# Patient Record
Sex: Female | Born: 1935 | Race: Black or African American | Hispanic: No | Smoking: Never smoker
Health system: Southern US, Community
[De-identification: ages and names within clinical notes are randomized; demographics above are authoritative.]

## PROBLEM LIST (undated history)

## (undated) DIAGNOSIS — I10 Essential (primary) hypertension: Secondary | ICD-10-CM

## (undated) DIAGNOSIS — Z9289 Personal history of other medical treatment: Secondary | ICD-10-CM

## (undated) DIAGNOSIS — D649 Anemia, unspecified: Secondary | ICD-10-CM

## (undated) DIAGNOSIS — I2510 Atherosclerotic heart disease of native coronary artery without angina pectoris: Secondary | ICD-10-CM

## (undated) DIAGNOSIS — J189 Pneumonia, unspecified organism: Secondary | ICD-10-CM

## (undated) DIAGNOSIS — S069X9A Unspecified intracranial injury with loss of consciousness of unspecified duration, initial encounter: Secondary | ICD-10-CM

## (undated) DIAGNOSIS — E119 Type 2 diabetes mellitus without complications: Secondary | ICD-10-CM

## (undated) DIAGNOSIS — N189 Chronic kidney disease, unspecified: Secondary | ICD-10-CM

## (undated) DIAGNOSIS — R0600 Dyspnea, unspecified: Secondary | ICD-10-CM

## (undated) DIAGNOSIS — J45909 Unspecified asthma, uncomplicated: Secondary | ICD-10-CM

## (undated) DIAGNOSIS — M1990 Unspecified osteoarthritis, unspecified site: Secondary | ICD-10-CM

## (undated) HISTORY — DX: Essential (primary) hypertension: I10

## (undated) HISTORY — DX: Type 2 diabetes mellitus without complications: E11.9

## (undated) HISTORY — DX: Anemia, unspecified: D64.9

## (undated) HISTORY — PX: ROTATOR CUFF REPAIR: SHX139

## (undated) HISTORY — PX: COLONOSCOPY: SHX174

## (undated) HISTORY — DX: Chronic kidney disease, unspecified: N18.9

## (undated) HISTORY — PX: CORONARY ANGIOPLASTY WITH STENT PLACEMENT: SHX49

---

## 1973-01-11 HISTORY — PX: ABDOMINAL HYSTERECTOMY: SHX81

## 2004-02-18 ENCOUNTER — Ambulatory Visit: Payer: Self-pay | Admitting: Cardiology

## 2004-02-19 ENCOUNTER — Ambulatory Visit: Payer: Self-pay | Admitting: Cardiology

## 2004-02-19 ENCOUNTER — Observation Stay (HOSPITAL_COMMUNITY): Admission: EM | Admit: 2004-02-19 | Discharge: 2004-02-21 | Payer: Self-pay | Admitting: Cardiology

## 2004-03-05 ENCOUNTER — Ambulatory Visit: Payer: Self-pay | Admitting: Cardiology

## 2004-04-09 ENCOUNTER — Ambulatory Visit: Payer: Self-pay | Admitting: Cardiology

## 2007-05-08 ENCOUNTER — Encounter: Admission: RE | Admit: 2007-05-08 | Discharge: 2007-05-08 | Payer: Self-pay | Admitting: Orthopaedic Surgery

## 2007-05-11 ENCOUNTER — Ambulatory Visit (HOSPITAL_BASED_OUTPATIENT_CLINIC_OR_DEPARTMENT_OTHER): Admission: RE | Admit: 2007-05-11 | Discharge: 2007-05-12 | Payer: Self-pay | Admitting: Orthopaedic Surgery

## 2010-02-01 ENCOUNTER — Encounter: Payer: Self-pay | Admitting: Endocrinology

## 2010-05-26 NOTE — Op Note (Signed)
NAMESHANIKKA, AUGUSTE NO.:  [id number]   MEDICAL RECORD NO.:  [id number]          PATIENT TYPE:  AMB   LOCATION:  DSC                          FACILITY:  [facility]   PHYSICIAN:  Vonna Kotyk. Whitfield, M.D.DATE OF BIRTH:  [birth year]   DATE OF PROCEDURE:  05/11/2007  DATE OF DISCHARGE:                               OPERATIVE REPORT   PREOPERATIVE DIAGNOSES:  1. Rotator cuff tear, right shoulder with impingement.  2. Degenerative joint disease, acromioclavicular joint.   POSTOPERATIVE DIAGNOSES:  1. Rotator cuff tear, right shoulder with impingement.  2. Degenerative joint disease, acromioclavicular joint.  3. Biceps tendon rupture.   PROCEDURES:  1. Arthroscopic debridement, right shoulder.  2. Arthroscopic subacromial decompression.  3. Arthroscopic distal clavicle resection.  4. Mini-open rotator cuff tear repair surgery.   SURGEON:  Vonna Kotyk. Durward Fortes, M.D.   ASSISTANT:  Vonita Moss. Duffy, PAC   ANESTHESIA:  General with supplemental interscalene nerve block.   COMPLICATIONS:  None.   HISTORY:  This 77 year old diabetic who has been experiencing right  shoulder pain since December 2008.  Her primary care physician has  injected her shoulder with cortisone on 2 occasions.  She has been  taking pain medicine and tried some exercises and continues to have  significant pain to the point of compromise.  She is unable to have an  MRI scan secondary to claustrophobia and has had a shoulder arthrogram  revealing a full-thickness rotator cuff tear.  She has evidence of  impingement by the [facility] joint osteoarthropathy and a downsloping acromion  and is now to have a arthroscopic evaluation.   DESCRIPTION OF PROCEDURE:  With the patient comfortable on operating  table and under general orotracheal anesthesia, the patient was placed  in semi-sitting position with the shoulder frame.  She did receive a  preoperative interscalene nerve block.  The right shoulder was then  prepped with DuraPrep in base of the base of neck circumferentially, and  below the elbow, sterile draping was performed.   Marking pen was used to outline the [facility] joint, the coracoid, and the  acromion at a point a fingerbreadth posterior medial to the posterior  angle of acromion, a small stab wound was made.  The arthroscope was  placed in the shoulder joint posteriorly.  The cannula inserted through  a separate portal anteriorly.  There was moderate synovitis.  There was  absence of the biceps tendon with a small stump at the superior glenoid.  The labrum appeared to be intact.  There was an obvious rotator cuff  tear.  Debridement of the biceps tendon stump and synovitis was  performed through the anterior portal using the ArthroCare wand and the  Cuda shaver.  There was some mild chondromalacia of the humeral head and  glenoid.   The arthroscope was placed at subacromial space posteriorly.  The  cannula subacromial space anteriorly and anteroinferior acromioplasty  was performed.  There was considerable overhang and spurring of the  acromion both anteriorly and laterally, had a nice decompression.  There  was considerable hypertrophy about the [facility] joint.  Distal clavicle  resection was also performed with a 6 mm bur.  There was considerable  synovitis at the [facility] joint.  The cuff tear was identified to the bursal  surface.   A mini-open rotator cuff tear repair was then performed.  About an inch  and a half incision was made along the anterior aspect of the shoulder  via sharp dissection, carried down to subcutaneous tissue.  There was  abundant adipose tissue.  This was incised with the Bovie.  Deltoid  fascia was incised with the Bovie and then I entered the subacromial  space.  The rotator cuff tear was seen and was stellate with a number of  ragged edges.  I debrided the cuff.  There was considerable delamination  which was also debrided.  The biceps tendon was not identified.   Bleeding bone was established along the proximal humeral head.  The  rotator cuff tear was then repaired from its superior margin inferiorly  with 0 Ethibond suture.  A single Mitek anchor was placed at the very  end to secure the cuff to bleeding bone.  By finger palpation, there was  no evidence of any impingement at that point.  Wound was irrigated with  saline solution.  The deltoid fascia closed with a running 0 Vicryl,  subcu with 2-0 Vicryl, and skin closed with the Steri-Strips.  Sterile  bulky dressing was applied followed by a sling.   PLAN:  Recovery care, discharge in a.m., followup in 1 week.      Vonna Kotyk. Durward Fortes, M.D.  Electronically Signed     PWW/MEDQ  D:  05/11/2007  T:  05/12/2007  Job:  [id number]

## 2010-05-29 NOTE — Discharge Summary (Signed)
Donna, Ochoa NO.:  [id number]   MEDICAL RECORD NO.:  [id number]          PATIENT TYPE:  INP   LOCATION:  2034                         FACILITY:  [facility]   PHYSICIAN:  Dola Argyle, M.D.     DATE OF BIRTH:  07-31-1933   DATE OF ADMISSION:  02/19/2004  DATE OF DISCHARGE:  02/21/2004                                 DISCHARGE SUMMARY   PROCEDURES:  1.  Cardiac catheterization.  2.  Coronary arteriogram.  3.  Left ventriculogram.   HOSPITAL COURSE:  Donna Ochoa is a 77 year old female with no known history  of coronary artery disease.  She had cardiac risk factors including  diabetes, age, family history.  She went to [facility] for exertional  chest discomfort.  She was evaluated there by Dr. Ron Parker and Dr. Dannielle Burn and it  was felt that she needed further evaluation and catheterization.  She was  transported to [facility] on February 19, 2004.   Cardiac catheterization was performed on February 20, 2004.  Her EF was  normal with no wall motion abnormalities.  She had a 40% diagonal and a 40%  and a 50% LAD and a 50% proximal diagonal stenosis as well.  She had a 50-  60% stenosis in the circumflex and the RCA was nondominant with no  significant disease.  Dr. Lia Foyer evaluated the films and felt she had  diffuse atherosclerosis, but no critical obstructions and normal left  ventricular function.  He recommended medical therapy with aggressive risk  factor reduction.   On February 21, 2004 [name] was evaluated by Dr. Dannielle Burn.  She was having  no shortness of breath or chest pain.  He recommended continuing her on  Plavix as she has a significant aspirin allergy.  He added an ACE inhibitor  and she is to follow up with Dr. Ronne Binning for diabetes management.  She was  also started on a low dose of a beta blocker of Lopressor 12.5 mg p.o.  b.i.d.  She is to follow up in the office with Dr. Dannielle Burn as well as with  Dr. Ronne Binning.  [name] was  considered stable for discharge on February 21, 2004 with outpatient follow-up arranged.   DISCHARGE DIAGNOSES:  1.  Chest pain, no critical coronary artery disease by catheterization.      Negative myocardial infarction by enzymes.  2.  Hypertension.  3.  Diabetes.  4.  Unknown lipid status.  5.  Allergy or intolerance to sulfa, prednisone, nonsteroidals, and aspirin.  6.  Asthma.  7.  Obesity.  8.  Status post hysterectomy.  9.  Status post colonoscopy and polypectomy.  62. Family history of congestive heart failure in her brother who died at      age 40.  11. Gastroesophageal reflux disease.   DISCHARGE INSTRUCTIONS:  Her activity level is to include no driving or  strenuous activity for two days.  She is to stick to a low fat diabetic  diet.  She is to call the office for problems with the catheterization site.  She is to follow up  with Dr. Ronne Binning and call for an appointment.  She has  an appointment with Dr. Dannielle Burn on Thursday, February 23 at noon.   DISCHARGE MEDICATIONS:  1.  Plavix 75 mg daily.  2.  Lisinopril 10 mg daily.  3.  Nitroglycerin 0.4 mg p.r.n.  4.  Lopressor 25 mg one-half tablet b.i.d.  5.  Theo-Dur 300 mg b.i.d.  6.  Glyburide 10 mg b.i.d.  7.  Zyrtec/Ativan p.r.n.  8.  Advair 100/50 b.i.d.  9.  Glucophage 1000 mg b.i.d.  Hold and restart on February 23, 2004.      RB/MEDQ  D:  02/21/2004  T:  02/21/2004  Job:  [id number]   cc:   Ronne Binning, M.D.

## 2010-05-29 NOTE — Cardiovascular Report (Signed)
NAMEBRIT, BOYAN NO.:  [id number]   MEDICAL RECORD NO.:  [id number]          PATIENT TYPE:  INP   LOCATION:  2034                         FACILITY:  [name]   PHYSICIAN:  Loretha Brasil. Lia Foyer, M.D. [facility] OF BIRTH:  10-Nov-1933   DATE OF PROCEDURE:  02/20/2004  DATE OF DISCHARGE:                              CARDIAC CATHETERIZATION   INDICATIONS:  Donna Ochoa is a 76 year old who presents with an episode of  chest pain. She had an abnormal Cardiolite with an inferior defect as well  as suggestion of a defect in the anteroapical segment. The inferior defect  appeared to be reversible. Because of this and her multiple risk factors she  was brought to the catheterization laboratory for further evaluation and  treatment.   PROCEDURE:  1.  Left heart catheterization.  2.  Selective coronary arteriography.  3.  Selective left ventriculography.   DESCRIPTION OF PROCEDURE:  The patient was brought to the catheterization  laboratory and prepped and draped in the usual fashion. Through an anterior  puncture the right femoral artery was easily entered. A 6-French sheath was  then placed. Views of the left and right coronary arteries were obtained in  multiple angiographic projections. I was concerned about the possibility of  a defect in the proximal left anterior descending artery and for this  additional views were obtained to better lay out this area. Overall, she  tolerated the procedure well.  At the completion of the procedure I spoke  with Dr. Dannielle Burn and I had him review the Cardiolite images in [name] as I was  reviewing the films. She was subsequently taken to the holding area in  satisfactory clinical condition. There were no complications.   HEMODYNAMIC DATA:  1.  Central aortic pressure 128/75, mean 96.  2.  Left ventricle 135/8.  3.  No gradient pullback across aortic valve.   ANGIOGRAPHIC DATA:  1.  Ventriculography was done in the RAO projection. Overall  systolic      function was vigorous. No segmental abnormalities or contraction were      identified.  2.  The left main coronary artery was free of critical disease and was very      large caliber, being at least about 6 mm.  3.  The left anterior descending artery courses to the apex. The LAD has two      fairly small diagonal branches a major septal perforator. In the mid      vessel the vessel appears to take an intramyocardial course and a distal      vessel wraps the apex. At the very proximal portion of the left anterior      descending artery there is an eccentric plaque. This probably ranges in      the range of about 40-50%. It does not appear to be flow limiting  and      we took multiple views of this in multiple angiographic projections. My      estimate would be that it would not likely be flow-limiting.  4.  There is a fairly large ramus intermedius vessel.  This has about 40%      ostial narrowing and then the mid vessel appears to be acentric with      about 50% narrowing.  5.  The circumflex is a dominant vessel. There is 50 to at most 60%      narrowing in the mid vessel. This is very eccentric and smooth with a      residual lumen of greater than 2 mm likely, again, not representing flow-      limiting disease.  6.  The right coronary artery is a nondominant vessel and appears to be      nonobstructive.   CONCLUSION:  1.  Well-preserved left ventricular function.  2.  Multiple lesions of the coronary arteries with none appearing to be high      grade or flow-limiting in nature.   RECOMMENDATIONS:  Without question the patient has evidence of coronary  atherosclerosis. The vessels are very large in caliber. The mid LAD would be  estimated at at least about 4 mm in size. Importantly, there are multiple  areas of plaquing but none of these appear to be high grade. Based on this,  aggressive risk factor reduction would be recommended. Fortunately, the  patient does not  smoke. Optimization of her hemoglobin A1C and control of  lipids would be appropriate therapy at this time. I have discussed this case  with Dr. Dannielle Burn and we will review again in further detail.      [name]  D:  02/20/2004  T:  02/21/2004  Job:  [id number]   cc:   CV Lab   [name], M.D.   [name], M.D. [facility]

## 2010-06-12 DIAGNOSIS — R9431 Abnormal electrocardiogram [ECG] [EKG]: Secondary | ICD-10-CM

## 2010-10-06 LAB — BASIC METABOLIC PANEL
BUN: 14
Calcium: 9.3
Creatinine, Ser: 0.98
GFR calc non Af Amer: 56 — ABNORMAL LOW
Glucose, Bld: 291 — ABNORMAL HIGH

## 2011-08-17 DIAGNOSIS — M79609 Pain in unspecified limb: Secondary | ICD-10-CM

## 2012-01-12 DIAGNOSIS — S069X9A Unspecified intracranial injury with loss of consciousness of unspecified duration, initial encounter: Secondary | ICD-10-CM

## 2012-01-12 HISTORY — DX: Unspecified intracranial injury with loss of consciousness of unspecified duration, initial encounter: S06.9X9A

## 2012-11-23 DIAGNOSIS — E875 Hyperkalemia: Secondary | ICD-10-CM | POA: Insufficient documentation

## 2012-11-23 DIAGNOSIS — I10 Essential (primary) hypertension: Secondary | ICD-10-CM | POA: Insufficient documentation

## 2012-11-23 DIAGNOSIS — D72829 Elevated white blood cell count, unspecified: Secondary | ICD-10-CM | POA: Insufficient documentation

## 2012-11-23 DIAGNOSIS — R7989 Other specified abnormal findings of blood chemistry: Secondary | ICD-10-CM | POA: Insufficient documentation

## 2012-11-23 DIAGNOSIS — E119 Type 2 diabetes mellitus without complications: Secondary | ICD-10-CM | POA: Insufficient documentation

## 2012-11-23 DIAGNOSIS — IMO0002 Reserved for concepts with insufficient information to code with codable children: Secondary | ICD-10-CM | POA: Insufficient documentation

## 2012-11-23 DIAGNOSIS — N390 Urinary tract infection, site not specified: Secondary | ICD-10-CM | POA: Insufficient documentation

## 2012-11-23 DIAGNOSIS — K769 Liver disease, unspecified: Secondary | ICD-10-CM | POA: Insufficient documentation

## 2012-11-23 DIAGNOSIS — N189 Chronic kidney disease, unspecified: Secondary | ICD-10-CM | POA: Insufficient documentation

## 2012-11-23 DIAGNOSIS — N289 Disorder of kidney and ureter, unspecified: Secondary | ICD-10-CM | POA: Insufficient documentation

## 2012-11-23 DIAGNOSIS — E669 Obesity, unspecified: Secondary | ICD-10-CM | POA: Insufficient documentation

## 2012-11-23 DIAGNOSIS — N281 Cyst of kidney, acquired: Secondary | ICD-10-CM | POA: Insufficient documentation

## 2012-11-23 DIAGNOSIS — S065X9A Traumatic subdural hemorrhage with loss of consciousness of unspecified duration, initial encounter: Secondary | ICD-10-CM | POA: Insufficient documentation

## 2012-11-23 DIAGNOSIS — R799 Abnormal finding of blood chemistry, unspecified: Secondary | ICD-10-CM | POA: Insufficient documentation

## 2012-11-23 DIAGNOSIS — R739 Hyperglycemia, unspecified: Secondary | ICD-10-CM | POA: Insufficient documentation

## 2013-12-10 ENCOUNTER — Encounter (INDEPENDENT_AMBULATORY_CARE_PROVIDER_SITE_OTHER): Payer: Self-pay

## 2013-12-10 ENCOUNTER — Encounter (INDEPENDENT_AMBULATORY_CARE_PROVIDER_SITE_OTHER): Payer: Self-pay | Admitting: *Deleted

## 2014-04-12 DIAGNOSIS — I2510 Atherosclerotic heart disease of native coronary artery without angina pectoris: Secondary | ICD-10-CM

## 2014-04-12 HISTORY — DX: Atherosclerotic heart disease of native coronary artery without angina pectoris: I25.10

## 2014-04-14 DIAGNOSIS — I200 Unstable angina: Secondary | ICD-10-CM | POA: Insufficient documentation

## 2014-04-18 DIAGNOSIS — J810 Acute pulmonary edema: Secondary | ICD-10-CM | POA: Insufficient documentation

## 2014-04-18 DIAGNOSIS — I214 Non-ST elevation (NSTEMI) myocardial infarction: Secondary | ICD-10-CM | POA: Insufficient documentation

## 2014-04-18 DIAGNOSIS — Z888 Allergy status to other drugs, medicaments and biological substances status: Secondary | ICD-10-CM | POA: Insufficient documentation

## 2014-04-18 DIAGNOSIS — N184 Chronic kidney disease, stage 4 (severe): Secondary | ICD-10-CM | POA: Insufficient documentation

## 2014-04-18 DIAGNOSIS — N390 Urinary tract infection, site not specified: Secondary | ICD-10-CM | POA: Insufficient documentation

## 2014-04-18 DIAGNOSIS — D649 Anemia, unspecified: Secondary | ICD-10-CM | POA: Insufficient documentation

## 2014-04-30 ENCOUNTER — Encounter (HOSPITAL_COMMUNITY)
Admission: RE | Admit: 2014-04-30 | Discharge: 2014-04-30 | Disposition: A | Discharge status: Home / Self Care | Payer: Medicare Other | Source: Ambulatory Visit | Attending: Cardiovascular Disease | Admitting: Cardiovascular Disease

## 2014-04-30 ENCOUNTER — Encounter (HOSPITAL_COMMUNITY): Payer: Self-pay

## 2014-04-30 VITALS — BP 170/68 | HR 83 | Ht 66.0 in | Wt 190.8 lb

## 2014-04-30 DIAGNOSIS — I214 Non-ST elevation (NSTEMI) myocardial infarction: Secondary | ICD-10-CM | POA: Insufficient documentation

## 2014-04-30 DIAGNOSIS — Z955 Presence of coronary angioplasty implant and graft: Secondary | ICD-10-CM | POA: Insufficient documentation

## 2014-04-30 NOTE — Progress Notes (Signed)
Cardiac/Pulmonary Rehab Medication Review by a Pharmacist  Does the patient  feel that his/her medications are working for him/her?  yes  Has the patient been experiencing any side effects to the medications prescribed?  no  Does the patient measure his/her own blood pressure or blood glucose at home?  yes   Does the patient have any problems obtaining medications due to transportation or finances?   no  Understanding of regimen: good Understanding of indications: good Potential of compliance: good  Questions asked to Determine Patient Understanding of Medication Regimen:  1. What is the name of the medication?  2. What is the medication used for?  3. When should it be taken?  4. How much should be taken?  5. How will you take it?  6. What side effects should you report?  Understanding Defined as: Excellent: All questions above are correct Good: Questions 1-4 are correct Fair: Questions 1-2 are correct  Poor: 1 or none of the above questions are correct   Pharmacist comments: Pt does check her blood glucose at home but not her blood pressure.  Pt states she used to be allergic to ASA but in hospital she was told she is not allergic and takes it daily.  No side effects from medications reported.  Pt is ALLERGIC to CODEINE per her report.    Hart Robinsons A 04/30/2014 2:50 PM

## 2014-04-30 NOTE — Patient Instructions (Signed)
Pt has finished orientation and is scheduled to start CR on May 06, 2014 at 1535 pm. Pt has been instructed to arrive to class 15 minutes early for scheduled class. Pt has been instructed to wear comfortable clothing and shoes with rubber soles. Pt has been told to take their medications 1 hour prior to coming to class.  If the patient is not going to attend class, she has been instructed to call.

## 2014-04-30 NOTE — Progress Notes (Signed)
Patient arrived at 92.  Patient referred to Cardiac Rehab by Dr. Hamilton Capri due to NSTEMI/Stent (Z98.61)  Dr. Hamilton Capri is her cardiologist and Dr. Woody Seller is her PCP.  During orientation advised patient on arrival and appointment times what to wear, what to do before, during and after exercise.  Reviewed attendance and class policy.  Talked about inclement weather and class consultation policy. Patient is scheduled to start cardiac Rehab on May 06, 2014 at 1545 pm. Patient was advised to come to class 5 minutes before class starts.  She was also given instructions on meeting with the dietician and attending the Family Structure classes. Pt is eager to get started.  Patient able to finish six minute walk test without assistance. Education and orientation ended at 1630.

## 2014-05-06 ENCOUNTER — Encounter (HOSPITAL_COMMUNITY)
Admission: RE | Admit: 2014-05-06 | Discharge: 2014-05-06 | Disposition: A | Discharge status: Home / Self Care | Payer: Medicare Other | Source: Ambulatory Visit | Attending: Cardiovascular Disease | Admitting: Cardiovascular Disease

## 2014-05-06 DIAGNOSIS — I214 Non-ST elevation (NSTEMI) myocardial infarction: Secondary | ICD-10-CM | POA: Diagnosis not present

## 2014-05-08 ENCOUNTER — Encounter (HOSPITAL_COMMUNITY)
Admission: RE | Admit: 2014-05-08 | Discharge: 2014-05-08 | Disposition: A | Discharge status: Home / Self Care | Payer: Medicare Other | Source: Ambulatory Visit | Attending: Cardiovascular Disease | Admitting: Cardiovascular Disease

## 2014-05-08 DIAGNOSIS — I214 Non-ST elevation (NSTEMI) myocardial infarction: Secondary | ICD-10-CM | POA: Diagnosis not present

## 2014-05-08 NOTE — Progress Notes (Signed)
Cardiac Rehabilitation Program Outcomes Report   Orientation:  04/30/14 Graduate Date:  tbd Discharge Date:  tbd # of sessions completed: 3  Cardiologist: Clevenger Family MD:  Leslye Peer Time:  [id number]  A.  Exercise Program:  Tolerates exercise @ 3.59 METS for 15 minutes and Walk Test Results:  Pre: 2.10  B.  Mental Health:  Good mental attitude  C.  Education/Instruction/Skills  Accurately checks own pulse.  Rest:  81  Exercise:  113  Uses Perceived Exertion Scale and/or Dyspnea Scale  D.  Nutrition/Weight Control/Body Composition:  Adherence to prescribed nutrition program: fair    E.  Blood Lipids   No results found for: CHOL, HDL, LDLCALC, LDLDIRECT, TRIG, CHOLHDL  F.  Lifestyle Changes:  Making positive lifestyle changes  G.  Symptoms noted with exercise:  Asymptomatic  Report Completed By:  Stevphen Rochester RN   Comments:  This is patients first week note.

## 2014-05-10 ENCOUNTER — Encounter (HOSPITAL_COMMUNITY)
Admission: RE | Admit: 2014-05-10 | Discharge: 2014-05-10 | Disposition: A | Discharge status: Home / Self Care | Payer: Medicare Other | Source: Ambulatory Visit | Attending: Cardiovascular Disease | Admitting: Cardiovascular Disease

## 2014-05-10 DIAGNOSIS — I214 Non-ST elevation (NSTEMI) myocardial infarction: Secondary | ICD-10-CM | POA: Diagnosis not present

## 2014-05-13 ENCOUNTER — Encounter (HOSPITAL_COMMUNITY)
Admission: RE | Admit: 2014-05-13 | Discharge: 2014-05-13 | Disposition: A | Discharge status: Home / Self Care | Payer: Medicare Other | Source: Ambulatory Visit | Attending: Cardiovascular Disease | Admitting: Cardiovascular Disease

## 2014-05-13 DIAGNOSIS — I214 Non-ST elevation (NSTEMI) myocardial infarction: Secondary | ICD-10-CM | POA: Insufficient documentation

## 2014-05-13 DIAGNOSIS — Z955 Presence of coronary angioplasty implant and graft: Secondary | ICD-10-CM | POA: Insufficient documentation

## 2014-05-15 ENCOUNTER — Encounter (HOSPITAL_COMMUNITY)
Admission: RE | Admit: 2014-05-15 | Discharge: 2014-05-15 | Disposition: A | Discharge status: Home / Self Care | Payer: Medicare Other | Source: Ambulatory Visit | Attending: Cardiovascular Disease | Admitting: Cardiovascular Disease

## 2014-05-15 DIAGNOSIS — I214 Non-ST elevation (NSTEMI) myocardial infarction: Secondary | ICD-10-CM | POA: Diagnosis not present

## 2014-05-17 ENCOUNTER — Encounter (HOSPITAL_COMMUNITY)
Admission: RE | Admit: 2014-05-17 | Discharge: 2014-05-17 | Disposition: A | Discharge status: Home / Self Care | Payer: Medicare Other | Source: Ambulatory Visit | Attending: Cardiovascular Disease | Admitting: Cardiovascular Disease

## 2014-05-17 DIAGNOSIS — I214 Non-ST elevation (NSTEMI) myocardial infarction: Secondary | ICD-10-CM | POA: Diagnosis not present

## 2014-05-20 ENCOUNTER — Encounter (HOSPITAL_COMMUNITY)
Admission: RE | Admit: 2014-05-20 | Discharge: 2014-05-20 | Disposition: A | Discharge status: Home / Self Care | Payer: Medicare Other | Source: Ambulatory Visit | Attending: Cardiovascular Disease | Admitting: Cardiovascular Disease

## 2014-05-20 DIAGNOSIS — I214 Non-ST elevation (NSTEMI) myocardial infarction: Secondary | ICD-10-CM | POA: Diagnosis not present

## 2014-05-22 ENCOUNTER — Encounter (HOSPITAL_COMMUNITY)
Admission: RE | Admit: 2014-05-22 | Discharge: 2014-05-22 | Disposition: A | Discharge status: Home / Self Care | Payer: Medicare Other | Source: Ambulatory Visit | Attending: Cardiovascular Disease | Admitting: Cardiovascular Disease

## 2014-05-22 DIAGNOSIS — I214 Non-ST elevation (NSTEMI) myocardial infarction: Secondary | ICD-10-CM | POA: Diagnosis not present

## 2014-05-24 ENCOUNTER — Encounter (HOSPITAL_COMMUNITY)
Admission: RE | Admit: 2014-05-24 | Discharge: 2014-05-24 | Disposition: A | Discharge status: Home / Self Care | Payer: Medicare Other | Source: Ambulatory Visit | Attending: Cardiovascular Disease | Admitting: Cardiovascular Disease

## 2014-05-24 DIAGNOSIS — I214 Non-ST elevation (NSTEMI) myocardial infarction: Secondary | ICD-10-CM | POA: Diagnosis not present

## 2014-05-27 ENCOUNTER — Encounter (HOSPITAL_COMMUNITY)
Admission: RE | Admit: 2014-05-27 | Discharge: 2014-05-27 | Disposition: A | Discharge status: Home / Self Care | Payer: Medicare Other | Source: Ambulatory Visit | Attending: Cardiovascular Disease | Admitting: Cardiovascular Disease

## 2014-05-27 DIAGNOSIS — I214 Non-ST elevation (NSTEMI) myocardial infarction: Secondary | ICD-10-CM | POA: Diagnosis not present

## 2014-05-29 ENCOUNTER — Encounter (HOSPITAL_COMMUNITY)
Admission: RE | Admit: 2014-05-29 | Discharge: 2014-05-29 | Disposition: A | Discharge status: Home / Self Care | Payer: Medicare Other | Source: Ambulatory Visit | Attending: Cardiovascular Disease | Admitting: Cardiovascular Disease

## 2014-05-29 DIAGNOSIS — I214 Non-ST elevation (NSTEMI) myocardial infarction: Secondary | ICD-10-CM | POA: Diagnosis not present

## 2014-05-30 NOTE — Progress Notes (Signed)
Called patients doctor, Dr. Hamilton Capri on 05/29/14 to inform him of her recurring high blood pressure reading here in Cardiac Rehab. Dr. Dion Body nurse Susy Manor, RN called back on 05/30/14 to inquire. Several BP readings where shared. She said she would talk to Dr. Hamilton Capri and let him decide what needed to be done about bring her blood pressure WNL. They will call the patient if they needed to add medications.

## 2014-05-31 ENCOUNTER — Encounter (HOSPITAL_COMMUNITY)
Admission: RE | Admit: 2014-05-31 | Discharge: 2014-05-31 | Disposition: A | Discharge status: Home / Self Care | Payer: Medicare Other | Source: Ambulatory Visit | Attending: Cardiovascular Disease | Admitting: Cardiovascular Disease

## 2014-05-31 DIAGNOSIS — I214 Non-ST elevation (NSTEMI) myocardial infarction: Secondary | ICD-10-CM | POA: Diagnosis not present

## 2014-06-03 ENCOUNTER — Encounter (HOSPITAL_COMMUNITY)
Admission: RE | Admit: 2014-06-03 | Discharge: 2014-06-03 | Disposition: A | Discharge status: Home / Self Care | Payer: Medicare Other | Source: Ambulatory Visit | Attending: Cardiovascular Disease | Admitting: Cardiovascular Disease

## 2014-06-03 DIAGNOSIS — I214 Non-ST elevation (NSTEMI) myocardial infarction: Secondary | ICD-10-CM | POA: Diagnosis not present

## 2014-06-05 ENCOUNTER — Encounter (HOSPITAL_COMMUNITY)
Admission: RE | Admit: 2014-06-05 | Discharge: 2014-06-05 | Disposition: A | Discharge status: Home / Self Care | Payer: Medicare Other | Source: Ambulatory Visit | Attending: Cardiovascular Disease | Admitting: Cardiovascular Disease

## 2014-06-05 DIAGNOSIS — I214 Non-ST elevation (NSTEMI) myocardial infarction: Secondary | ICD-10-CM | POA: Diagnosis not present

## 2014-06-05 NOTE — Progress Notes (Signed)
Received a call back from Donna Manor, RN, Dr. Dion Body nurse concerning Donna Ochoa's blood pressure. She informed Donna Ochoa that Dr. Hamilton Capri said there is no further action to be done by him. He is feels fine about her Blood Pressure. He said that he felt we should follow up with her PCP Dr. Woody Seller about changing and/or adding to the patients medications. Will do that before the patient sees her PCP.

## 2014-06-07 ENCOUNTER — Encounter (HOSPITAL_COMMUNITY)
Admission: RE | Admit: 2014-06-07 | Discharge: 2014-06-07 | Disposition: A | Discharge status: Home / Self Care | Payer: Medicare Other | Source: Ambulatory Visit | Attending: Cardiovascular Disease | Admitting: Cardiovascular Disease

## 2014-06-07 DIAGNOSIS — I214 Non-ST elevation (NSTEMI) myocardial infarction: Secondary | ICD-10-CM | POA: Diagnosis not present

## 2014-06-10 ENCOUNTER — Encounter (HOSPITAL_COMMUNITY): Payer: Medicare Other

## 2014-06-12 ENCOUNTER — Encounter (HOSPITAL_COMMUNITY)
Admission: RE | Admit: 2014-06-12 | Discharge: 2014-06-12 | Disposition: A | Discharge status: Home / Self Care | Payer: Medicare Other | Source: Ambulatory Visit | Attending: Cardiovascular Disease | Admitting: Cardiovascular Disease

## 2014-06-12 DIAGNOSIS — Z955 Presence of coronary angioplasty implant and graft: Secondary | ICD-10-CM | POA: Diagnosis not present

## 2014-06-12 DIAGNOSIS — I214 Non-ST elevation (NSTEMI) myocardial infarction: Secondary | ICD-10-CM | POA: Insufficient documentation

## 2014-06-14 ENCOUNTER — Encounter (HOSPITAL_COMMUNITY)
Admission: RE | Admit: 2014-06-14 | Discharge: 2014-06-14 | Disposition: A | Discharge status: Home / Self Care | Payer: Medicare Other | Source: Ambulatory Visit | Attending: Cardiovascular Disease | Admitting: Cardiovascular Disease

## 2014-06-14 DIAGNOSIS — I214 Non-ST elevation (NSTEMI) myocardial infarction: Secondary | ICD-10-CM | POA: Diagnosis not present

## 2014-06-14 NOTE — Progress Notes (Signed)
Cardiac Rehabilitation Program Outcomes Report   Orientation:  04/30/14 Graduate Date:  tbd Discharge Date:  tbd # of sessions completed: 18  Cardiologist: Clevenger Family MD:  Leslye Peer Time:  [id number]  A.  Exercise Program:  Tolerates exercise @ 3.58 METS for 15 minutes  B.  Mental Health:  Good mental attitude  C.  Education/Instruction/Skills  Accurately checks own pulse.  Rest:  72  Exercise:  102 and Knows THR for exercise  Uses Perceived Exertion Scale and/or Dyspnea Scale  D.  Nutrition/Weight Control/Body Composition:  Adherence to prescribed nutrition program: good    E.  Blood Lipids   No results found for: CHOL, HDL, LDLCALC, LDLDIRECT, TRIG, CHOLHDL  F.  Lifestyle Changes:  Making positive lifestyle changes  G.  Symptoms noted with exercise:  Asymptomatic  Report Completed By:  Stevphen Rochester RN   Comments:  This is patients halfway progress note for [facility]. Patient is doing very well in program.

## 2014-06-17 ENCOUNTER — Encounter (HOSPITAL_COMMUNITY)
Admission: RE | Admit: 2014-06-17 | Discharge: 2014-06-17 | Disposition: A | Discharge status: Home / Self Care | Payer: Medicare Other | Source: Ambulatory Visit | Attending: Cardiovascular Disease | Admitting: Cardiovascular Disease

## 2014-06-17 DIAGNOSIS — I214 Non-ST elevation (NSTEMI) myocardial infarction: Secondary | ICD-10-CM | POA: Diagnosis not present

## 2014-06-19 ENCOUNTER — Encounter (HOSPITAL_COMMUNITY)
Admission: RE | Admit: 2014-06-19 | Discharge: 2014-06-19 | Disposition: A | Discharge status: Home / Self Care | Payer: Medicare Other | Source: Ambulatory Visit | Attending: Cardiovascular Disease | Admitting: Cardiovascular Disease

## 2014-06-19 DIAGNOSIS — I214 Non-ST elevation (NSTEMI) myocardial infarction: Secondary | ICD-10-CM | POA: Diagnosis not present

## 2014-06-21 ENCOUNTER — Encounter (HOSPITAL_COMMUNITY)
Admission: RE | Admit: 2014-06-21 | Discharge: 2014-06-21 | Disposition: A | Discharge status: Home / Self Care | Payer: Medicare Other | Source: Ambulatory Visit | Attending: Cardiovascular Disease | Admitting: Cardiovascular Disease

## 2014-06-21 DIAGNOSIS — I214 Non-ST elevation (NSTEMI) myocardial infarction: Secondary | ICD-10-CM | POA: Diagnosis not present

## 2014-06-24 ENCOUNTER — Encounter (HOSPITAL_COMMUNITY)
Admission: RE | Admit: 2014-06-24 | Discharge: 2014-06-24 | Disposition: A | Discharge status: Home / Self Care | Payer: Medicare Other | Source: Ambulatory Visit | Attending: Cardiovascular Disease | Admitting: Cardiovascular Disease

## 2014-06-24 DIAGNOSIS — I214 Non-ST elevation (NSTEMI) myocardial infarction: Secondary | ICD-10-CM | POA: Diagnosis not present

## 2014-06-26 ENCOUNTER — Encounter (HOSPITAL_COMMUNITY)
Admission: RE | Admit: 2014-06-26 | Discharge: 2014-06-26 | Disposition: A | Discharge status: Home / Self Care | Payer: Medicare Other | Source: Ambulatory Visit | Attending: Cardiovascular Disease | Admitting: Cardiovascular Disease

## 2014-06-26 DIAGNOSIS — I214 Non-ST elevation (NSTEMI) myocardial infarction: Secondary | ICD-10-CM | POA: Diagnosis not present

## 2014-06-28 ENCOUNTER — Encounter (HOSPITAL_COMMUNITY)
Admission: RE | Admit: 2014-06-28 | Discharge: 2014-06-28 | Disposition: A | Discharge status: Home / Self Care | Payer: Medicare Other | Source: Ambulatory Visit | Attending: Cardiovascular Disease | Admitting: Cardiovascular Disease

## 2014-06-28 DIAGNOSIS — I214 Non-ST elevation (NSTEMI) myocardial infarction: Secondary | ICD-10-CM | POA: Diagnosis not present

## 2014-07-01 ENCOUNTER — Encounter (HOSPITAL_COMMUNITY): Payer: Medicare Other

## 2014-07-03 ENCOUNTER — Encounter (HOSPITAL_COMMUNITY)
Admission: RE | Admit: 2014-07-03 | Discharge: 2014-07-03 | Disposition: A | Discharge status: Home / Self Care | Payer: Medicare Other | Source: Ambulatory Visit | Attending: Cardiovascular Disease | Admitting: Cardiovascular Disease

## 2014-07-03 DIAGNOSIS — I214 Non-ST elevation (NSTEMI) myocardial infarction: Secondary | ICD-10-CM | POA: Diagnosis not present

## 2014-07-05 ENCOUNTER — Encounter (HOSPITAL_COMMUNITY)
Admission: RE | Admit: 2014-07-05 | Discharge: 2014-07-05 | Disposition: A | Discharge status: Home / Self Care | Payer: Medicare Other | Source: Ambulatory Visit | Attending: Cardiovascular Disease | Admitting: Cardiovascular Disease

## 2014-07-05 DIAGNOSIS — I214 Non-ST elevation (NSTEMI) myocardial infarction: Secondary | ICD-10-CM | POA: Diagnosis not present

## 2014-07-08 ENCOUNTER — Encounter (HOSPITAL_COMMUNITY)
Admission: RE | Admit: 2014-07-08 | Discharge: 2014-07-08 | Disposition: A | Discharge status: Home / Self Care | Payer: Medicare Other | Source: Ambulatory Visit | Attending: Cardiovascular Disease | Admitting: Cardiovascular Disease

## 2014-07-08 DIAGNOSIS — I214 Non-ST elevation (NSTEMI) myocardial infarction: Secondary | ICD-10-CM | POA: Diagnosis not present

## 2014-07-10 ENCOUNTER — Encounter (HOSPITAL_COMMUNITY)
Admission: RE | Admit: 2014-07-10 | Discharge: 2014-07-10 | Disposition: A | Discharge status: Home / Self Care | Payer: Medicare Other | Source: Ambulatory Visit | Attending: Cardiovascular Disease | Admitting: Cardiovascular Disease

## 2014-07-10 DIAGNOSIS — I214 Non-ST elevation (NSTEMI) myocardial infarction: Secondary | ICD-10-CM | POA: Diagnosis not present

## 2014-07-12 ENCOUNTER — Encounter (HOSPITAL_COMMUNITY)
Admission: RE | Admit: 2014-07-12 | Discharge: 2014-07-12 | Disposition: A | Discharge status: Home / Self Care | Payer: Medicare Other | Source: Ambulatory Visit | Attending: Cardiovascular Disease | Admitting: Cardiovascular Disease

## 2014-07-12 DIAGNOSIS — I214 Non-ST elevation (NSTEMI) myocardial infarction: Secondary | ICD-10-CM | POA: Diagnosis not present

## 2014-07-12 DIAGNOSIS — Z955 Presence of coronary angioplasty implant and graft: Secondary | ICD-10-CM | POA: Diagnosis not present

## 2014-07-15 ENCOUNTER — Encounter (HOSPITAL_COMMUNITY): Payer: Medicare Other

## 2014-07-17 ENCOUNTER — Encounter (HOSPITAL_COMMUNITY)
Admission: RE | Admit: 2014-07-17 | Discharge: 2014-07-17 | Disposition: A | Discharge status: Home / Self Care | Payer: Medicare Other | Source: Ambulatory Visit | Attending: Cardiovascular Disease | Admitting: Cardiovascular Disease

## 2014-07-17 DIAGNOSIS — I214 Non-ST elevation (NSTEMI) myocardial infarction: Secondary | ICD-10-CM | POA: Diagnosis not present

## 2014-07-19 ENCOUNTER — Encounter (HOSPITAL_COMMUNITY)
Admission: RE | Admit: 2014-07-19 | Discharge: 2014-07-19 | Disposition: A | Discharge status: Home / Self Care | Payer: Medicare Other | Source: Ambulatory Visit | Attending: Cardiovascular Disease | Admitting: Cardiovascular Disease

## 2014-07-19 DIAGNOSIS — I214 Non-ST elevation (NSTEMI) myocardial infarction: Secondary | ICD-10-CM | POA: Diagnosis not present

## 2014-07-22 ENCOUNTER — Encounter (HOSPITAL_COMMUNITY)
Admission: RE | Admit: 2014-07-22 | Discharge: 2014-07-22 | Disposition: A | Discharge status: Home / Self Care | Payer: Medicare Other | Source: Ambulatory Visit | Attending: Cardiovascular Disease | Admitting: Cardiovascular Disease

## 2014-07-22 DIAGNOSIS — I214 Non-ST elevation (NSTEMI) myocardial infarction: Secondary | ICD-10-CM | POA: Diagnosis not present

## 2014-07-24 ENCOUNTER — Encounter (HOSPITAL_COMMUNITY)
Admission: RE | Admit: 2014-07-24 | Discharge: 2014-07-24 | Disposition: A | Discharge status: Home / Self Care | Payer: Medicare Other | Source: Ambulatory Visit | Attending: Cardiovascular Disease | Admitting: Cardiovascular Disease

## 2014-07-24 DIAGNOSIS — I214 Non-ST elevation (NSTEMI) myocardial infarction: Secondary | ICD-10-CM | POA: Diagnosis not present

## 2014-07-26 ENCOUNTER — Encounter (HOSPITAL_COMMUNITY)
Admission: RE | Admit: 2014-07-26 | Discharge: 2014-07-26 | Disposition: A | Discharge status: Home / Self Care | Payer: Medicare Other | Source: Ambulatory Visit | Attending: Cardiovascular Disease | Admitting: Cardiovascular Disease

## 2014-07-26 DIAGNOSIS — I214 Non-ST elevation (NSTEMI) myocardial infarction: Secondary | ICD-10-CM | POA: Diagnosis not present

## 2014-07-29 ENCOUNTER — Encounter (HOSPITAL_COMMUNITY)
Admission: RE | Admit: 2014-07-29 | Discharge: 2014-07-29 | Disposition: A | Discharge status: Home / Self Care | Payer: Medicare Other | Source: Ambulatory Visit | Attending: Cardiovascular Disease | Admitting: Cardiovascular Disease

## 2014-07-29 DIAGNOSIS — I214 Non-ST elevation (NSTEMI) myocardial infarction: Secondary | ICD-10-CM | POA: Diagnosis not present

## 2014-07-31 ENCOUNTER — Encounter (HOSPITAL_COMMUNITY)
Admission: RE | Admit: 2014-07-31 | Discharge: 2014-07-31 | Disposition: A | Discharge status: Home / Self Care | Payer: Medicare Other | Source: Ambulatory Visit | Attending: Cardiovascular Disease | Admitting: Cardiovascular Disease

## 2014-07-31 DIAGNOSIS — I214 Non-ST elevation (NSTEMI) myocardial infarction: Secondary | ICD-10-CM | POA: Diagnosis not present

## 2014-08-02 ENCOUNTER — Encounter (HOSPITAL_COMMUNITY): Payer: Medicare Other

## 2014-08-05 NOTE — Progress Notes (Signed)
Patient is discharged from [place] [facility] program today, July 31, 2014 with 36 sessions.  She achieved LTG of 30 minutes of aerobic exercise at max met level of 3.60.  All patient vitals are WNL.  Patient has not met with dietician.  Discharge instructions have been reviewed in detail and patient expressed an understanding of material given.  Patient plans to exercise at home and possibly join the maintenance program. Cardiac Rehab will make 1 month, 6 month and 1 year call backs.  Patient had no complaints of any abnormal S/S or pain on their exit visit.  Patient finished post walk test.

## 2014-08-05 NOTE — Progress Notes (Signed)
Cardiac Rehabilitation Program Outcomes Report   Orientation:  04/30/14 Graduate Date:  07/31/14 Discharge Date:  07/31/14 # of sessions completed: 36  Cardiologist: Hamilton Capri Family MD:  Leslye Peer Time:  0815  A.  Exercise Program:  Tolerates exercise @ 3.60 METS for 15 minutes, Improved functional capacity  16.7 % and Improved  flexibility 3.17 %  B.  Mental Health:  Good mental attitude  C.  Education/Instruction/Skills  Accurately checks own pulse.  Rest:  82  Exercise:  101, Knows THR for exercise, Uses Perceived Exertion Scale and/or Dyspnea Scale and Attended all education classes    D.  Nutrition/Weight Control/Body Composition:  Adherence to prescribed nutrition program: fair  and Evidence of fat weight loss   E.  Blood Lipids   No results found for: CHOL, HDL, LDLCALC, LDLDIRECT, TRIG, CHOLHDL  F.  Lifestyle Changes:  Making positive lifestyle changes  G.  Symptoms noted with exercise:  Asymptomatic  Report Completed By:  Stevphen Rochester RN   Comments:  Patient has graduated [facility] today.  Patient has done very well in the program.  States is going to try exercising at home and if does not work will come to maintenance.

## 2014-10-02 ENCOUNTER — Other Ambulatory Visit (HOSPITAL_COMMUNITY): Payer: Self-pay | Admitting: Nephrology

## 2014-10-02 DIAGNOSIS — N183 Chronic kidney disease, stage 3 unspecified: Secondary | ICD-10-CM

## 2014-10-11 ENCOUNTER — Ambulatory Visit (HOSPITAL_COMMUNITY)
Admission: RE | Admit: 2014-10-11 | Discharge: 2014-10-11 | Disposition: A | Discharge status: Home / Self Care | Payer: Medicare Other | Source: Ambulatory Visit | Attending: Nephrology | Admitting: Nephrology

## 2014-10-11 DIAGNOSIS — N189 Chronic kidney disease, unspecified: Secondary | ICD-10-CM | POA: Diagnosis not present

## 2014-10-11 DIAGNOSIS — N183 Chronic kidney disease, stage 3 unspecified: Secondary | ICD-10-CM

## 2015-05-30 ENCOUNTER — Other Ambulatory Visit (HOSPITAL_COMMUNITY): Payer: Self-pay | Admitting: Nephrology

## 2015-05-30 ENCOUNTER — Ambulatory Visit (HOSPITAL_COMMUNITY)
Admission: RE | Admit: 2015-05-30 | Discharge: 2015-05-30 | Disposition: A | Discharge status: Home / Self Care | Payer: Medicare Other | Source: Ambulatory Visit | Attending: Vascular Surgery | Admitting: Vascular Surgery

## 2015-05-30 DIAGNOSIS — N183 Chronic kidney disease, stage 3 unspecified: Secondary | ICD-10-CM

## 2015-06-03 ENCOUNTER — Encounter: Payer: Self-pay | Admitting: Nephrology

## 2015-09-11 ENCOUNTER — Other Ambulatory Visit: Payer: Self-pay | Admitting: Surgery

## 2015-09-11 DIAGNOSIS — N184 Chronic kidney disease, stage 4 (severe): Secondary | ICD-10-CM

## 2015-09-11 DIAGNOSIS — Z01810 Encounter for preprocedural cardiovascular examination: Secondary | ICD-10-CM

## 2015-09-17 ENCOUNTER — Encounter: Payer: Self-pay | Admitting: *Deleted

## 2015-09-17 ENCOUNTER — Ambulatory Visit (INDEPENDENT_AMBULATORY_CARE_PROVIDER_SITE_OTHER): Payer: Medicare Other | Admitting: Cardiology

## 2015-09-17 ENCOUNTER — Encounter: Payer: Self-pay | Admitting: Cardiology

## 2015-09-17 VITALS — BP 150/76 | HR 60 | Ht 66.0 in | Wt 193.0 lb

## 2015-09-17 DIAGNOSIS — I2510 Atherosclerotic heart disease of native coronary artery without angina pectoris: Secondary | ICD-10-CM

## 2015-09-17 DIAGNOSIS — E785 Hyperlipidemia, unspecified: Secondary | ICD-10-CM

## 2015-09-17 DIAGNOSIS — N184 Chronic kidney disease, stage 4 (severe): Secondary | ICD-10-CM

## 2015-09-17 MED ORDER — ATORVASTATIN CALCIUM 80 MG PO TABS: 80.0000 mg | ORAL_TABLET | Freq: Every day | ORAL | 6 refills | Status: DC

## 2015-09-17 NOTE — Patient Instructions (Signed)
Your physician wants you to follow-up in: 6 MONTHS WITH DR BRANCH You will receive a reminder letter in the mail two months in advance. If you don't receive a letter, please call our office to schedule the follow-up appointment.  Your physician has recommended you make the following change in your medication:   STOP BRILINTA  Thank you for choosing [facility]!!    

## 2015-09-17 NOTE — Progress Notes (Signed)
Clinical Summary Ms. Lozano is a 82 y.o.female seen today as a new patient, she was previously followed at [facility] Cardiology  1. CAD - NSTEMI 04/2014 at [place], received DES to LCX. Echo at that time according to notes with normal LVEF.  - ASA allergy,notes indicate desensitization during 04/2014 admission at [facility]. Now taking ASA at home. - reports chest pains about 2 months ago, seen in hospital with negative workup. Denies any more recent symptoms. - compliant with meds. Still on brillinta. Was told to stop lisionpril in the past, she is not sure why.Looks like this is due to poor renal function.  She ran out of her atorva 80mg .    2. COPD - humidity causes SOB at times. - not using breo daily, only prn.   3. Anemia - followed by pcp Dr Woody Seller.    4. CKD IV - followed by nephorology   Past medical history 1. CAD 2. CKD IV 3. COPD     Allergies  Allergen Reactions  . Sulfa Antibiotics Itching and Rash  . Sulfamethoxazole Dermatitis     Current Outpatient Prescriptions  Medication Sig Dispense Refill  . aspirin EC 81 MG tablet Take 81 mg by mouth daily.    Marland Kitchen atorvastatin (LIPITOR) 80 MG tablet Take 1 tablet (80 mg total) by mouth at bedtime. 30 tablet 6  . furosemide (LASIX) 40 MG tablet Take 40 mg by mouth daily.    . insulin aspart (NOVOLOG) 100 UNIT/ML injection Inject 20 Units into the skin 2 (two) times daily with a meal.    . insulin glargine (LANTUS) 100 UNIT/ML injection Inject 40 Units into the skin at bedtime.     . metoprolol succinate (TOPROL-XL) 25 MG 24 hr tablet Take 25 mg by mouth daily.    . nitroGLYCERIN (NITROSTAT) 0.4 MG SL tablet Place 0.4 mg under the tongue every 5 (five) minutes as needed for chest pain.    . sodium bicarbonate 650 MG tablet Take 1,300 mg by mouth 2 (two) times daily.     No current facility-administered medications for this visit.      No past surgical history on file.   Allergies  Allergen Reactions  . Sulfa  Antibiotics Itching and Rash  . Sulfamethoxazole Dermatitis      Family History  Problem Relation Age of Onset  . Diabetes Mother   . Diabetes Father   . Heart failure Sister   . Diabetes Brother   . Diabetes Maternal Grandmother   . Diabetes Maternal Grandfather   . Diabetes Paternal Grandmother   . Diabetes Paternal Grandfather   . Diabetes Sister   . Diabetes Sister   . Diabetes Brother      Social History Ms. Benefiel reports that she has never smoked. She has never used smokeless tobacco. Ms. Guyse reports that she does not drink alcohol.   Review of Systems CONSTITUTIONAL: No weight loss, fever, chills, weakness or fatigue.  HEENT: Eyes: No visual loss, blurred vision, double vision or yellow sclerae.No hearing loss, sneezing, congestion, runny nose or sore throat.  SKIN: No rash or itching.  CARDIOVASCULAR: per HPI RESPIRATORY: No shortness of breath, cough or sputum.  GASTROINTESTINAL: No anorexia, nausea, vomiting or diarrhea. No abdominal pain or blood.  GENITOURINARY: No burning on urination, no polyuria NEUROLOGICAL: No headache, dizziness, syncope, paralysis, ataxia, numbness or tingling in the extremities. No change in bowel or bladder control.  MUSCULOSKELETAL: No muscle, back pain, joint pain or stiffness.  LYMPHATICS:  No enlarged nodes. No history of splenectomy.  PSYCHIATRIC: No history of depression or anxiety.  ENDOCRINOLOGIC: No reports of sweating, cold or heat intolerance. No polyuria or polydipsia.  Marland Kitchen   Physical Examination Vitals:   09/17/15 1033  BP: (!) 150/76  Pulse: 60   Filed Weights   09/17/15 1033  Weight: 193 lb (87.5 kg)    Gen: resting comfortably, no acute distress HEENT: no scleral icterus, pupils equal round and reactive, no palptable cervical adenopathy,  CV: RRR, no m/r/g, no jvd Resp: Clear to auscultation bilaterally GI: abdomen is soft, non-tender, non-distended, normal bowel sounds, no hepatosplenomegaly MSK:  extremities are warm, no edema.  Skin: warm, no rash Neuro:  no focal deficits Psych: appropriate affect   Diagnostic Studies  02/2004 cath HEMODYNAMIC DATA:  1.  Central aortic pressure 128/75, mean 96.  2.  Left ventricle 135/8.  3.  No gradient pullback across aortic valve.   ANGIOGRAPHIC DATA:  1.  Ventriculography was done in the RAO projection. Overall systolic      function was vigorous. No segmental abnormalities or contraction were      identified.  2.  The left main coronary artery was free of critical disease and was very      large caliber, being at least about 6 mm.  3.  The left anterior descending artery courses to the apex. The LAD has two      fairly small diagonal branches a major septal perforator. In the mid      vessel the vessel appears to take an intramyocardial course and a distal      vessel wraps the apex. At the very proximal portion of the left anterior      descending artery there is an eccentric plaque. This probably ranges in      the range of about 40-50%. It does not appear to be flow limiting  and      we took multiple views of this in multiple angiographic projections. My      estimate would be that it would not likely be flow-limiting.  4.  There is a fairly large ramus intermedius vessel. This has about 40%      ostial narrowing and then the mid vessel appears to be acentric with      about 50% narrowing.  5.  The circumflex is a dominant vessel. There is 50 to at most 60%      narrowing in the mid vessel. This is very eccentric and smooth with a      residual lumen of greater than 2 mm likely, again, not representing flow-      limiting disease.  6.  The right coronary artery is a nondominant vessel and appears to be      nonobstructive.   CONCLUSION:  1.  Well-preserved left ventricular function.  2.  Multiple lesions of the coronary arteries with none appearing to be high      grade or flow-limiting in nature.   RECOMMENDATIONS:   Without question the patient has evidence of coronary  atherosclerosis. The vessels are very large in caliber. The mid LAD would be  estimated at at least about 4 mm in size. Importantly, there are multiple  areas of plaquing but none of these appear to be high grade. Based on this,  aggressive risk factor reduction would be recommended. Fortunately, the  patient does not smoke. Optimization of her hemoglobin A1C and control of  lipids would be appropriate therapy at this  time. I have discussed this case  with Dr. Dannielle Burn and we will review again in further detail.   [facility] cath 04/2014 CORONARY ANGIOGRAPHY: Culprit vessel intitial: 95% stenosis in the proximal aspect of the lesion 75% stenosis in the mid to distal aspect of the lesion. Lesion length 15 mm. Disease segment 18-20 mm. Type BII lesion. Initial flow was TIMI-3 flow. Culprit vessel post intervention: No residual stenosis. Good stent apposition. TIMI 3 flow.    Assessment and Plan  1. CAD  - no current symptoms. No indication for continued DAPT, she will stop brillinta - EKG in clinic today shows SR, RBBB, LAFB.   2. COPD - encouarged to use breo daily as opposed to prn  3. CKD IV - followed by neprhology, will request there records  4. Hyperlipidemia - request labs from pcp. Continue high dose statin.      Arnoldo Lenis, M.D.

## 2015-10-08 ENCOUNTER — Encounter: Payer: Self-pay | Admitting: Surgery

## 2015-10-15 ENCOUNTER — Ambulatory Visit (HOSPITAL_COMMUNITY): Admission: RE | Admit: 2015-10-15 | Payer: Medicare Other | Source: Ambulatory Visit

## 2015-10-15 ENCOUNTER — Ambulatory Visit: Payer: Medicare Other | Admitting: Surgery

## 2015-10-15 ENCOUNTER — Ambulatory Visit (HOSPITAL_COMMUNITY): Payer: Medicare Other | Attending: Vascular Surgery

## 2015-10-24 ENCOUNTER — Encounter: Payer: Self-pay | Admitting: Surgery

## 2015-10-27 ENCOUNTER — Ambulatory Visit (HOSPITAL_COMMUNITY)
Admission: RE | Admit: 2015-10-27 | Discharge: 2015-10-27 | Disposition: A | Discharge status: Home / Self Care | Payer: Medicare Other | Source: Ambulatory Visit | Attending: Surgery | Admitting: Surgery

## 2015-10-27 ENCOUNTER — Ambulatory Visit (INDEPENDENT_AMBULATORY_CARE_PROVIDER_SITE_OTHER): Payer: Medicare Other | Admitting: Surgery

## 2015-10-27 ENCOUNTER — Encounter: Payer: Self-pay | Admitting: Surgery

## 2015-10-27 VITALS — BP 176/76 | HR 51 | Temp 97.6°F | Resp 16 | Ht 66.0 in | Wt 198.2 lb

## 2015-10-27 DIAGNOSIS — N184 Chronic kidney disease, stage 4 (severe): Secondary | ICD-10-CM

## 2015-10-27 DIAGNOSIS — Z01810 Encounter for preprocedural cardiovascular examination: Secondary | ICD-10-CM | POA: Diagnosis not present

## 2015-10-27 NOTE — Progress Notes (Signed)
Vascular and Vein Specialist of [place]  Patient name: Donna Ochoa MRN: [id number] DOB: 1933-05-25 Sex: female  REFERRING PHYSICIAN: Dr. Lowanda Foster  REASON FOR CONSULT: Dialysis access  HPI: Donna Ochoa is a 82 y.o. female, who is referred today for dialysis access.  She has stage IV renal insufficiency, secondary to diabetes and hypertension.  She is right-handed.  She suffers from hypertension which is well-controlled on an ARB.  She is on a statin for hypercholesterolemia.  She suffers from diabetes which is not well controlled.  Her renal failure has the associated symptoms of anemia and leg edema.  Past Medical History:  Diagnosis Date  . Anemia   . Chronic kidney disease   . Diabetes mellitus without complication ([place])   . Hypertension     Family History  Problem Relation Age of Onset  . Diabetes Mother   . Diabetes Father   . Heart failure Sister   . Diabetes Brother   . Heart disease Brother     before age 79  . Diabetes Maternal Grandmother   . Diabetes Maternal Grandfather   . Diabetes Paternal Grandmother   . Diabetes Paternal Grandfather   . Diabetes Sister   . Diabetes Sister   . Diabetes Brother     SOCIAL HISTORY: Social History   Social History  . Marital status: Married    Spouse name: N/A  . Number of children: N/A  . Years of education: N/A   Occupational History  . Not on file.   Social History Main Topics  . Smoking status: Never Smoker  . Smokeless tobacco: Never Used  . Alcohol use No  . Drug use: No  . Sexual activity: No   Other Topics Concern  . Not on file   Social History Narrative  . No narrative on file    Allergies  Allergen Reactions  . Sulfa Antibiotics Itching and Rash  . Sulfamethoxazole Dermatitis    Current Outpatient Prescriptions  Medication Sig Dispense Refill  . aspirin EC 81 MG tablet Take 81 mg by mouth daily.    Marland Kitchen atorvastatin (LIPITOR) 80 MG tablet Take 1  tablet (80 mg total) by mouth at bedtime. 30 tablet 6  . furosemide (LASIX) 40 MG tablet Take 40 mg by mouth daily.    . insulin aspart (NOVOLOG) 100 UNIT/ML injection Inject 20 Units into the skin 2 (two) times daily with a meal.    . insulin glargine (LANTUS) 100 UNIT/ML injection Inject 40 Units into the skin at bedtime.     . metoprolol succinate (TOPROL-XL) 25 MG 24 hr tablet Take 25 mg by mouth daily.    . nitroGLYCERIN (NITROSTAT) 0.4 MG SL tablet Place 0.4 mg under the tongue every 5 (five) minutes as needed for chest pain.    . sodium bicarbonate 650 MG tablet Take 1,300 mg by mouth 2 (two) times daily.     No current facility-administered medications for this visit.     REVIEW OF SYSTEMS:  [X]  denotes positive finding, [ ]  denotes negative finding Cardiac  Comments:  Chest pain or chest pressure:    Shortness of breath upon exertion:    Short of breath when lying flat:    Irregular heart rhythm:        Vascular    Pain in calf, thigh, or hip brought on by ambulation:    Pain in feet at night that wakes you up from your sleep:     Blood clot in your veins:  Leg swelling:  x       Pulmonary    Oxygen at home:    Productive cough:     Wheezing:         Neurologic    Sudden weakness in arms or legs:     Sudden numbness in arms or legs:     Sudden onset of difficulty speaking or slurred speech:    Temporary loss of vision in one eye:     Problems with dizziness:         Gastrointestinal    Blood in stool:     Vomited blood:         Genitourinary    Burning when urinating:     Blood in urine:        Psychiatric    Major depression:         Hematologic    Bleeding problems:    Problems with blood clotting too easily:        Skin    Rashes or ulcers:        Constitutional    Fever or chills:      PHYSICAL EXAM: Vitals:   10/27/15 1113 10/27/15 1115  BP: (!) 173/67 (!) 176/76  Pulse: (!) 51   Resp: 16   Temp: 97.6 F (36.4 C)   TempSrc: Oral     SpO2: 96%   Weight: 198 lb 3.2 oz (89.9 kg)   Height: 5\' 6"  (1.676 m)     GENERAL: The patient is a well-nourished female, in no acute distress. The vital signs are documented above. CARDIAC: There is a regular rate and rhythm.  VASCULAR: Palpable left radial and brachial pulse PULMONARY: Non-labored respiration. ABDOMEN: Soft and non-tender with normal pitched bowel sounds.  NEUROLOGIC: No focal weakness or paresthesias are detected. SKIN: There are no ulcers or rashes noted. PSYCHIATRIC: The patient has a normal affect.  DATA:  I have reviewed her vein mapping which shows that her cephalic vein on the left arm ranges from 0.5-0.19 cm in the upper arm.  Her basilic vein ranges from 0.4-0.60 cm. Her arterial duplex was within normal limits  ASSESSMENT AND PLAN: Stage IV renal insufficiency: I discussed proceeding with a left brachiocephalic versus a left first stage basilic vein procedure.  I discussed that if a basilic vein was utilized, she would need a second stage procedure to elevate the vein.  We talked about the risks and benefits of the operation including but not limited to the need for future interventions, the risk of non-maturity, and the risk of steal syndrome.  All of her questions were answered.  Her operation has been scheduled for Thursday, November 2.   Annamarie Major, MD [facility] Ve[facility] Specialists of [facility] [phone] Pager [phone]

## 2015-10-28 ENCOUNTER — Other Ambulatory Visit: Payer: Self-pay

## 2015-11-04 ENCOUNTER — Encounter: Payer: Self-pay | Admitting: *Deleted

## 2015-11-12 ENCOUNTER — Encounter (HOSPITAL_COMMUNITY): Payer: Self-pay | Admitting: *Deleted

## 2015-11-12 NOTE — Progress Notes (Signed)
Anesthesia Chart Review: SAME DAY WORK-UP.  Patient is a 82 year old female scheduled for left AVF creation on 11/13/15 by Dr. Trula Slade.  History includes non-smoker, HTN, CAD/NSTEMI s/p DES LCX 04/17/14, anemia, asthma, DM2, CKD stage IV, hysterectomy, arthritis.   PCP is Dr. Woody Seller. Nephrologist is Dr. Lowanda Foster. Cardiologist is Dr. Harl Bowie, last visit 09/17/15. Seen as a new patient, as she was previously followed by [facility] (Dr. Hamilton Capri). In regards to her CAD he wrote, "no current symptoms. No indication for continued DAPT, she will stop brillinta."  Meds include amlodipine, ASA 81 mg, Lipitor, Lasix, Lantus, Humalog, Toprol XL, Nitro, sodium bicarbonate.  09/17/15 EKG ([facility]): SR, right BBB, LAFB.  10/14/14 Echo [facility]): The left ventricular chamber size is normal. Mild concentric LVH. Global left ventricular wall motion and contractility are within normal limits. There is normal left ventricular systolic function. Estimated EF 60-65%. Abnormal left ventricular diastolic function. The left ventricular diastolic filling pattern is consistent with pseudonormalization, grade 2 diastolic dysfunction. Left atrium is mildly to moderately dilated. RA is mildly dilated. Aortic valve is trileaflet. Mild aortic cusp sclerosis. Systolic excursion is normal. Trace aortic regurgitation. Moderate mitral regurgitation. Mild tricuspid regurgitation. There is evidence of mild pulmonary hypertension. Trace pulmonic regurgitation. No dilation of the ascending aorta.  04/15/14 Cardiac cath [facility]; Care Everywhere): 1. Left Main - no angiographic stenosis. Trifurcates distally 2. Left anterior descending artery - mild proximal plaque. Long eccentric 50% stenosis mid LAD. Distal LAD notable for mild irregularities a. Diagonal branches - 2 small diagonal branches. Mild irregularities. 3. Ramus intermedius-50-70% long eccentric stenosis in proximal segment. Mild diffuse  irregularities mid to distally. 4. Left Circumflex - anatomically dominant. 95% eccentric stenosis at the proximal to mid segment. 90% mid vessel stenosis. Mild diffuse irregularities in the mid to distal segment. The circumflex continues on in the posterior interventricular groove as  a small posterior descending artery. TIMI 2 flow a. Obtuse Marginal branches - 2 small marginal branches with mild irregularities. b. Posterior descending artery-small with diffuse 5. Right Coronary Artery - anatomically nondominant. Angiographically normal. 2 right ventricular branches-angiographically normal. Left Ventriculography - Not Performed to conserve contrast due to stage 3-4 acute on chronic kidney disease.  CONCLUSIONS:  1. Non-ST elevation myocardial infarction. 2. Single vessel obstructive coronary disease of left circumflex and proximal to midsegment, with moderate disease of LAD and ramus intermedius. Left circumflex as culprit vessel for non-ST elevation myocardial infarction RECOMMENDATIONS:  1. Aspirin desensitization 2. Discussed options with regard to percutaneous coronary intervention of left circumflex. Aspirin desensitization  04/17/14 PCI ([facility]; Care Everywhere): IMPRESSION:  1. Non-ST which myocardial infarction. 2. Single vessel obstructive coronary disease of mid left circumflex 3. Advanced chronic kidney dysfunction, stage III-stage IV 4. Successful percutaneous coronary intervention with deployment of drug-eluting stent with utilization of minimal contrast 30 cc Isovue. RECOMMENDATIONS:  1. Dual antiplatelet therapy for one year. 2. Aggressive cardiac risk factor modification.  No stress test at [facility]. (10/14/14 "Nuclear" study encounter in [place] appears related to a VQ scan that was done at [facility]--discussed in 10/30/14 office visit with Dr. Hamilton Capri.)  10/15/14 PCXR [facility]): Impression: Slight pulmonary vascular  congestion.  She is for labs on arrival.  Patient with PCI in 2016. She has had recent cardiology follow-up. If labs acceptable and otherwise no acute changes then I would anticipate that she can proceed as planned.  George Hugh [facility] Short Stay Center/Anesthesiology Phone [phone] 11/12/2015 3:53 PM

## 2015-11-12 NOTE — Progress Notes (Signed)
Spoke with pt for pre-op call. Pt has hx of CAD with 1 stent placed 04/17/15 (See Care Everywhere). Pt denies any recnt chest pain or sob. Pt is diabetic, states last A1C was 7.8 on 10/26/15. States fasting blood sugar usually run in the low 100's. Pt instructed by Dr. Stephens Shire office to reduce Lantus insulin by half tonight (will take 20 units) and not to use Humalog insulin in AM. If blood sugar is 70 or below, treat with 1/2 cup of clear juice (apple or cranberry) and recheck blood sugar 15 minutes after drinking juice. If blood sugar continues to be 70 or below, call the Short Stay department and ask to speak to a nurse. Pt voiced understanding.

## 2015-11-13 ENCOUNTER — Encounter (HOSPITAL_COMMUNITY)
Admission: RE | Disposition: A | Discharge status: Home / Self Care | Payer: Self-pay | Source: Ambulatory Visit | Attending: Surgery

## 2015-11-13 ENCOUNTER — Ambulatory Visit (HOSPITAL_COMMUNITY): Payer: Medicare Other | Admitting: Vascular Surgery

## 2015-11-13 ENCOUNTER — Encounter (HOSPITAL_COMMUNITY): Payer: Self-pay | Admitting: General Practice

## 2015-11-13 ENCOUNTER — Ambulatory Visit (HOSPITAL_COMMUNITY)
Admission: RE | Admit: 2015-11-13 | Discharge: 2015-11-13 | Disposition: A | Discharge status: Home / Self Care | Payer: Medicare Other | Source: Ambulatory Visit | Attending: Surgery | Admitting: Surgery

## 2015-11-13 DIAGNOSIS — Z7982 Long term (current) use of aspirin: Secondary | ICD-10-CM | POA: Insufficient documentation

## 2015-11-13 DIAGNOSIS — I2510 Atherosclerotic heart disease of native coronary artery without angina pectoris: Secondary | ICD-10-CM | POA: Insufficient documentation

## 2015-11-13 DIAGNOSIS — E7800 Pure hypercholesterolemia, unspecified: Secondary | ICD-10-CM | POA: Insufficient documentation

## 2015-11-13 DIAGNOSIS — Z955 Presence of coronary angioplasty implant and graft: Secondary | ICD-10-CM | POA: Insufficient documentation

## 2015-11-13 DIAGNOSIS — M1990 Unspecified osteoarthritis, unspecified site: Secondary | ICD-10-CM | POA: Insufficient documentation

## 2015-11-13 DIAGNOSIS — E1122 Type 2 diabetes mellitus with diabetic chronic kidney disease: Secondary | ICD-10-CM | POA: Insufficient documentation

## 2015-11-13 DIAGNOSIS — N184 Chronic kidney disease, stage 4 (severe): Secondary | ICD-10-CM | POA: Insufficient documentation

## 2015-11-13 DIAGNOSIS — D649 Anemia, unspecified: Secondary | ICD-10-CM | POA: Insufficient documentation

## 2015-11-13 DIAGNOSIS — N185 Chronic kidney disease, stage 5: Secondary | ICD-10-CM | POA: Diagnosis not present

## 2015-11-13 DIAGNOSIS — I083 Combined rheumatic disorders of mitral, aortic and tricuspid valves: Secondary | ICD-10-CM | POA: Diagnosis not present

## 2015-11-13 DIAGNOSIS — I252 Old myocardial infarction: Secondary | ICD-10-CM | POA: Diagnosis not present

## 2015-11-13 DIAGNOSIS — Z794 Long term (current) use of insulin: Secondary | ICD-10-CM | POA: Insufficient documentation

## 2015-11-13 DIAGNOSIS — I129 Hypertensive chronic kidney disease with stage 1 through stage 4 chronic kidney disease, or unspecified chronic kidney disease: Secondary | ICD-10-CM | POA: Insufficient documentation

## 2015-11-13 HISTORY — DX: Atherosclerotic heart disease of native coronary artery without angina pectoris: I25.10

## 2015-11-13 HISTORY — DX: Unspecified asthma, uncomplicated: J45.909

## 2015-11-13 HISTORY — DX: Pneumonia, unspecified organism: J18.9

## 2015-11-13 HISTORY — DX: Unspecified osteoarthritis, unspecified site: M19.90

## 2015-11-13 HISTORY — PX: AV FISTULA PLACEMENT: SHX1204

## 2015-11-13 LAB — GLUCOSE, CAPILLARY
GLUCOSE-CAPILLARY: 262 mg/dL — AB (ref 65–99)
GLUCOSE-CAPILLARY: 265 mg/dL — AB (ref 65–99)
Glucose-Capillary: 221 mg/dL — ABNORMAL HIGH (ref 65–99)

## 2015-11-13 LAB — POCT I-STAT 4, (NA,K, GLUC, HGB,HCT)
Glucose, Bld: 254 mg/dL — ABNORMAL HIGH (ref 65–99)
HCT: 32 % — ABNORMAL LOW (ref 36.0–46.0)
Hemoglobin: 10.9 g/dL — ABNORMAL LOW (ref 12.0–15.0)
Potassium: 4.5 mmol/L (ref 3.5–5.1)
Sodium: 141 mmol/L (ref 135–145)

## 2015-11-13 SURGERY — ARTERIOVENOUS (AV) FISTULA CREATION
Anesthesia: Monitor Anesthesia Care | Site: Arm Upper | Laterality: Left

## 2015-11-13 MED ORDER — FENTANYL CITRATE (PF) 100 MCG/2ML IJ SOLN
INTRAMUSCULAR | Status: AC
  Filled 2015-11-13: qty 2

## 2015-11-13 MED ORDER — LIDOCAINE HCL 1 % IJ SOLN
INTRAMUSCULAR | Status: DC | PRN
  Administered 2015-11-13: 30 mL

## 2015-11-13 MED ORDER — CHLORHEXIDINE GLUCONATE CLOTH 2 % EX PADS: 6.0000 | MEDICATED_PAD | Freq: Once | CUTANEOUS | Status: DC

## 2015-11-13 MED ORDER — LIDOCAINE HCL (PF) 1 % IJ SOLN
INTRAMUSCULAR | Status: AC
  Filled 2015-11-13: qty 30

## 2015-11-13 MED ORDER — OXYCODONE-ACETAMINOPHEN 5-325 MG PO TABS: 1.0000 | ORAL_TABLET | Freq: Four times a day (QID) | ORAL | 0 refills | Status: DC | PRN

## 2015-11-13 MED ORDER — HEMOSTATIC AGENTS (NO CHARGE) OPTIME
TOPICAL | Status: DC | PRN
  Administered 2015-11-13: 1 via TOPICAL

## 2015-11-13 MED ORDER — LIDOCAINE-EPINEPHRINE 1 %-1:100000 IJ SOLN
INTRAMUSCULAR | Status: AC
  Filled 2015-11-13: qty 1

## 2015-11-13 MED ORDER — SODIUM CHLORIDE 0.9 % IV SOLN
INTRAVENOUS | Status: DC | PRN
  Administered 2015-11-13: 07:00:00

## 2015-11-13 MED ORDER — PROPOFOL 500 MG/50ML IV EMUL
INTRAVENOUS | Status: DC | PRN
Start: 2015-11-13 — End: 2015-11-13
  Administered 2015-11-13: 25 ug/kg/min via INTRAVENOUS

## 2015-11-13 MED ORDER — SODIUM CHLORIDE 0.9 % IV SOLN
INTRAVENOUS | Status: DC | PRN
  Administered 2015-11-13: 07:00:00 via INTRAVENOUS

## 2015-11-13 MED ORDER — INSULIN ASPART 100 UNIT/ML ~~LOC~~ SOLN
6.0000 [IU] | Freq: Once | SUBCUTANEOUS | Status: AC
  Administered 2015-11-13: 6 [IU] via SUBCUTANEOUS

## 2015-11-13 MED ORDER — LIDOCAINE 2% (20 MG/ML) 5 ML SYRINGE
INTRAMUSCULAR | Status: DC | PRN
  Administered 2015-11-13: 60 mg via INTRAVENOUS

## 2015-11-13 MED ORDER — INSULIN ASPART 100 UNIT/ML ~~LOC~~ SOLN
SUBCUTANEOUS | Status: AC
  Filled 2015-11-13: qty 1

## 2015-11-13 MED ORDER — 0.9 % SODIUM CHLORIDE (POUR BTL) OPTIME
TOPICAL | Status: DC | PRN
  Administered 2015-11-13: 1000 mL

## 2015-11-13 MED ORDER — SODIUM CHLORIDE 0.9 % IV SOLN: INTRAVENOUS | Status: DC

## 2015-11-13 MED ORDER — LIDOCAINE 2% (20 MG/ML) 5 ML SYRINGE
INTRAMUSCULAR | Status: AC
  Filled 2015-11-13: qty 5

## 2015-11-13 MED ORDER — DEXTROSE 5 % IV SOLN
1.5000 g | INTRAVENOUS | Status: AC
  Administered 2015-11-13: 1.5 g via INTRAVENOUS

## 2015-11-13 SURGICAL SUPPLY — 35 items
ARMBAND PINK RESTRICT EXTREMIT (MISCELLANEOUS) ×3 IMPLANT
CANISTER SUCTION 2500CC (MISCELLANEOUS) ×3 IMPLANT
CLIP TI MEDIUM 6 (CLIP) ×3 IMPLANT
CLIP TI WIDE RED SMALL 6 (CLIP) ×3 IMPLANT
COVER PROBE W GEL 5X96 (DRAPES) ×3 IMPLANT
DERMABOND ADVANCED (GAUZE/BANDAGES/DRESSINGS) ×2
DERMABOND ADVANCED .7 DNX12 (GAUZE/BANDAGES/DRESSINGS) ×1 IMPLANT
ELECT REM PT RETURN 9FT ADLT (ELECTROSURGICAL) ×3
ELECTRODE REM PT RTRN 9FT ADLT (ELECTROSURGICAL) ×1 IMPLANT
GLOVE BIO SURGEON STRL SZ 6.5 (GLOVE) ×2 IMPLANT
GLOVE BIO SURGEONS STRL SZ 6.5 (GLOVE) ×1
GLOVE BIOGEL PI IND STRL 6.5 (GLOVE) ×2 IMPLANT
GLOVE BIOGEL PI IND STRL 7.0 (GLOVE) ×1 IMPLANT
GLOVE BIOGEL PI IND STRL 7.5 (GLOVE) ×1 IMPLANT
GLOVE BIOGEL PI INDICATOR 6.5 (GLOVE) ×4
GLOVE BIOGEL PI INDICATOR 7.0 (GLOVE) ×2
GLOVE BIOGEL PI INDICATOR 7.5 (GLOVE) ×2
GLOVE ECLIPSE 6.5 STRL STRAW (GLOVE) ×3 IMPLANT
GLOVE SURG SS PI 7.5 STRL IVOR (GLOVE) ×3 IMPLANT
GOWN STRL REUS W/ TWL LRG LVL3 (GOWN DISPOSABLE) ×2 IMPLANT
GOWN STRL REUS W/ TWL XL LVL3 (GOWN DISPOSABLE) ×1 IMPLANT
GOWN STRL REUS W/TWL LRG LVL3 (GOWN DISPOSABLE) ×4
GOWN STRL REUS W/TWL XL LVL3 (GOWN DISPOSABLE) ×2
HEMOSTAT SNOW SURGICEL 2X4 (HEMOSTASIS) ×3 IMPLANT
KIT BASIN OR (CUSTOM PROCEDURE TRAY) ×3 IMPLANT
KIT ROOM TURNOVER OR (KITS) ×3 IMPLANT
NS IRRIG 1000ML POUR BTL (IV SOLUTION) ×3 IMPLANT
PACK CV ACCESS (CUSTOM PROCEDURE TRAY) ×3 IMPLANT
PAD ARMBOARD 7.5X6 YLW CONV (MISCELLANEOUS) ×6 IMPLANT
SUT PROLENE 6 0 CC (SUTURE) ×6 IMPLANT
SUT VIC AB 3-0 SH 27 (SUTURE) ×2
SUT VIC AB 3-0 SH 27X BRD (SUTURE) ×1 IMPLANT
SUT VICRYL 4-0 PS2 18IN ABS (SUTURE) ×3 IMPLANT
UNDERPAD 30X30 (UNDERPADS AND DIAPERS) ×3 IMPLANT
WATER STERILE IRR 1000ML POUR (IV SOLUTION) ×3 IMPLANT

## 2015-11-13 NOTE — OR Nursing (Signed)
Dr. Marcie Bal updated to [facility]: approves of pt d/c to home to take home regimen.

## 2015-11-13 NOTE — Anesthesia Procedure Notes (Signed)
Procedure Name: MAC Date/Time: 11/13/2015 7:38 AM Performed by: Candis Shine Pre-anesthesia Checklist: Patient identified, Emergency Drugs available, Suction available, Patient being monitored and Timeout performed Patient Re-evaluated:Patient Re-evaluated prior to inductionOxygen Delivery Method: Simple face mask Preoxygenation: Pre-oxygenation with 100% oxygen Intubation Type: IV induction Dental Injury: Teeth and Oropharynx as per pre-operative assessment

## 2015-11-13 NOTE — H&P (View-Only) (Signed)
Vascular and Vein Specialist of [place]  Patient name: Donna Ochoa MRN: [id number] DOB: 02/09/1933 Sex: female  REFERRING PHYSICIAN: Dr. Lowanda Foster  REASON FOR CONSULT: Dialysis access  HPI: Donna Ochoa is a 82 y.o. female, who is referred today for dialysis access.  She has stage IV renal insufficiency, secondary to diabetes and hypertension.  She is right-handed.  She suffers from hypertension which is well-controlled on an ARB.  She is on a statin for hypercholesterolemia.  She suffers from diabetes which is not well controlled.  Her renal failure has the associated symptoms of anemia and leg edema.  Past Medical History:  Diagnosis Date  . Anemia   . Chronic kidney disease   . Diabetes mellitus without complication ([place])   . Hypertension     Family History  Problem Relation Age of Onset  . Diabetes Mother   . Diabetes Father   . Heart failure Sister   . Diabetes Brother   . Heart disease Brother     before age 69  . Diabetes Maternal Grandmother   . Diabetes Maternal Grandfather   . Diabetes Paternal Grandmother   . Diabetes Paternal Grandfather   . Diabetes Sister   . Diabetes Sister   . Diabetes Brother     SOCIAL HISTORY: Social History   Social History  . Marital status: Married    Spouse name: N/A  . Number of children: N/A  . Years of education: N/A   Occupational History  . Not on file.   Social History Main Topics  . Smoking status: Never Smoker  . Smokeless tobacco: Never Used  . Alcohol use No  . Drug use: No  . Sexual activity: No   Other Topics Concern  . Not on file   Social History Narrative  . No narrative on file    Allergies  Allergen Reactions  . Sulfa Antibiotics Itching and Rash  . Sulfamethoxazole Dermatitis    Current Outpatient Prescriptions  Medication Sig Dispense Refill  . aspirin EC 81 MG tablet Take 81 mg by mouth daily.    Marland Kitchen atorvastatin (LIPITOR) 80 MG tablet Take 1  tablet (80 mg total) by mouth at bedtime. 30 tablet 6  . furosemide (LASIX) 40 MG tablet Take 40 mg by mouth daily.    . insulin aspart (NOVOLOG) 100 UNIT/ML injection Inject 20 Units into the skin 2 (two) times daily with a meal.    . insulin glargine (LANTUS) 100 UNIT/ML injection Inject 40 Units into the skin at bedtime.     . metoprolol succinate (TOPROL-XL) 25 MG 24 hr tablet Take 25 mg by mouth daily.    . nitroGLYCERIN (NITROSTAT) 0.4 MG SL tablet Place 0.4 mg under the tongue every 5 (five) minutes as needed for chest pain.    . sodium bicarbonate 650 MG tablet Take 1,300 mg by mouth 2 (two) times daily.     No current facility-administered medications for this visit.     REVIEW OF SYSTEMS:  [X]  denotes positive finding, [ ]  denotes negative finding Cardiac  Comments:  Chest pain or chest pressure:    Shortness of breath upon exertion:    Short of breath when lying flat:    Irregular heart rhythm:        Vascular    Pain in calf, thigh, or hip brought on by ambulation:    Pain in feet at night that wakes you up from your sleep:     Blood clot in your veins:  Leg swelling:  x       Pulmonary    Oxygen at home:    Productive cough:     Wheezing:         Neurologic    Sudden weakness in arms or legs:     Sudden numbness in arms or legs:     Sudden onset of difficulty speaking or slurred speech:    Temporary loss of vision in one eye:     Problems with dizziness:         Gastrointestinal    Blood in stool:     Vomited blood:         Genitourinary    Burning when urinating:     Blood in urine:        Psychiatric    Major depression:         Hematologic    Bleeding problems:    Problems with blood clotting too easily:        Skin    Rashes or ulcers:        Constitutional    Fever or chills:      PHYSICAL EXAM: Vitals:   10/27/15 1113 10/27/15 1115  BP: (!) 173/67 (!) 176/76  Pulse: (!) 51   Resp: 16   Temp: 97.6 F (36.4 C)   TempSrc: Oral     SpO2: 96%   Weight: 198 lb 3.2 oz (89.9 kg)   Height: 5\' 6"  (1.676 m)     GENERAL: The patient is a well-nourished female, in no acute distress. The vital signs are documented above. CARDIAC: There is a regular rate and rhythm.  VASCULAR: Palpable left radial and brachial pulse PULMONARY: Non-labored respiration. ABDOMEN: Soft and non-tender with normal pitched bowel sounds.  NEUROLOGIC: No focal weakness or paresthesias are detected. SKIN: There are no ulcers or rashes noted. PSYCHIATRIC: The patient has a normal affect.  DATA:  I have reviewed her vein mapping which shows that her cephalic vein on the left arm ranges from 0.5-0.19 cm in the upper arm.  Her basilic vein ranges from 0.4-0.60 cm. Her arterial duplex was within normal limits  ASSESSMENT AND PLAN: Stage IV renal insufficiency: I discussed proceeding with a left brachiocephalic versus a left first stage basilic vein procedure.  I discussed that if a basilic vein was utilized, she would need a second stage procedure to elevate the vein.  We talked about the risks and benefits of the operation including but not limited to the need for future interventions, the risk of non-maturity, and the risk of steal syndrome.  All of her questions were answered.  Her operation has been scheduled for Thursday, November 2.   Annamarie Major, MD Vascular and Vein Specialists of [facility] [phone] Pager [phone]

## 2015-11-13 NOTE — Interval H&P Note (Signed)
History and Physical Interval Note:  11/13/2015 7:30 AM  Donna Ochoa  has presented today for surgery, with the diagnosis of Stage IV Chronic Kidney Disease N18.4  The various methods of treatment have been discussed with the patient and family. After consideration of risks, benefits and other options for treatment, the patient has consented to  Procedure(s): ARTERIOVENOUS (AV) FISTULA CREATION (Left) as a surgical intervention .  The patient's history has been reviewed, patient examined, no change in status, stable for surgery.  I have reviewed the patient's chart and labs.  Questions were answered to the patient's satisfaction.     Annamarie Major

## 2015-11-13 NOTE — Op Note (Signed)
    Patient name: Donna Ochoa MRN: [id number] DOB: December 10, 1933 Sex: female  11/13/2015 Pre-operative Diagnosis: CKD Post-operative diagnosis:  Same Surgeon:  Annamarie Major Assistants:  Silva Bandy Procedure:   Left 1st stage basilic vein fistula Anesthesia:  MAC Blood Loss:  See anesthesia record Specimens:  none  Findings:  3 mm basilic vein  Indications:  patient here for HD access  Procedure:  The patient was identified in the holding area and taken to [facility]  The patient was then placed supine on the table. MAC anesthesia was administered.  The patient was prepped and draped in the usual sterile fashion.  A time out was called and antibiotics were administered.  I evaluated the veins in the upper arm with ultrasound.  The cephalic vein was not suitable, and therefore I elected to proceed with a basilic vein procedure.  One percent lidocaine was used for local anesthesia.  An oblique incision was made at the antecubital crease.  I first dissected out the brachial artery for a distance of approximately 2 cm.  It was healthy and disease-free measuring approximately 4 mm.  Next, I dissected out the basilic vein.  There was a medial branch that I elected to utilize.  It measured approximately 3 mm.  Once it was fully mobilized I marked it for orientation.  I did not give heparin.  The brachial artery was occluded with vascular clamps.  A #11 blade was used to make an arteriotomy which was extended longitudinally with Potts scissors.  The vein was cut to the appropriate length and spatulated.  A running anastomosis was created with 6-0 Prolene.  Prior to completion the appropriate flushing maneuvers were performed and the anastomosis was completed.  Once hemostasis was satisfactory the incision was closed with 2 layers of Vicryl and Dermabond.  There was excellent Doppler signal within the fistula as well as in the radial artery and ulnar artery at the wrist   Disposition:  To PACU in stable  condition.   Theotis Burrow, M.D. [facility] Office: [phone] Pager:  [phone]

## 2015-11-13 NOTE — Anesthesia Preprocedure Evaluation (Signed)
Anesthesia Evaluation  Patient identified by MRN, date of birth, ID band Patient awake    Reviewed: Allergy & Precautions, NPO status , Patient's Chart, lab work & pertinent test results  Airway Mallampati: II   Neck ROM: full    Dental   Pulmonary asthma ,    breath sounds clear to auscultation       Cardiovascular hypertension, + angina + CAD, + Past MI and + Cardiac Stents   Rhythm:regular Rate:Normal     Neuro/Psych    GI/Hepatic   Endo/Other  diabetes, Type 2  Renal/GU Renal InsufficiencyRenal disease     Musculoskeletal  (+) Arthritis ,   Abdominal   Peds  Hematology  (+) anemia ,   Anesthesia Other Findings   Reproductive/Obstetrics                             Anesthesia Physical Anesthesia Plan  ASA: III  Anesthesia Plan: MAC   Post-op Pain Management:    Induction: Intravenous  Airway Management Planned: Simple Face Mask  Additional Equipment:   Intra-op Plan:   Post-operative Plan:   Informed Consent: I have reviewed the patients History and Physical, chart, labs and discussed the procedure including the risks, benefits and alternatives for the proposed anesthesia with the patient or authorized representative who has indicated his/her understanding and acceptance.     Plan Discussed with: CRNA, Anesthesiologist and Surgeon  Anesthesia Plan Comments:         Anesthesia Quick Evaluation

## 2015-11-13 NOTE — Transfer of Care (Signed)
Immediate Anesthesia Transfer of Care Note  Patient: Donna Ochoa  Procedure(s) Performed: Procedure(s): ARTERIOVENOUS (AV) FISTULA CREATION UPPER ARM LEFT (Left)  Patient Location: PACU  Anesthesia Type:MAC  Level of Consciousness: awake, alert  and oriented  Airway & Oxygen Therapy: Patient Spontanous Breathing  Post-op Assessment: Report given to RN and Post -op Vital signs reviewed and stable  Post vital signs: Reviewed and stable  Last Vitals:  Vitals:   11/13/15 1000 11/13/15 1013  BP: 138/73 (!) 173/60  Pulse: 78 78  Resp: 13 16  Temp: 36.4 C     Last Pain:  Vitals:   11/13/15 0656  TempSrc: Oral      Patients Stated Pain Goal: 2 (06/77/03 4035)  Complications: No apparent anesthesia complications

## 2015-11-13 NOTE — Anesthesia Postprocedure Evaluation (Signed)
Anesthesia Post Note  Patient: Donna Ochoa  Procedure(s) Performed: Procedure(s) (LRB): ARTERIOVENOUS (AV) FISTULA CREATION UPPER ARM LEFT (Left)  Patient location during evaluation: PACU Anesthesia Type: MAC Level of consciousness: awake and alert Pain management: pain level controlled Vital Signs Assessment: post-procedure vital signs reviewed and stable Respiratory status: spontaneous breathing, nonlabored ventilation, respiratory function stable and patient connected to nasal cannula oxygen Cardiovascular status: stable and blood pressure returned to baseline Anesthetic complications: no    Last Vitals:  Vitals:   11/13/15 1000 11/13/15 1013  BP: 138/73 (!) 173/60  Pulse: 78 78  Resp: 13 16  Temp: 36.4 C     Last Pain:  Vitals:   11/13/15 0656  TempSrc: Oral                 Shawn Carattini S

## 2015-11-14 ENCOUNTER — Telehealth: Payer: Self-pay | Admitting: Surgery

## 2015-11-14 ENCOUNTER — Encounter (HOSPITAL_COMMUNITY): Payer: Self-pay | Admitting: Surgery

## 2015-11-14 NOTE — Telephone Encounter (Signed)
-----   Message from Mena Goes, RN sent at 11/13/2015  3:08 PM EDT ----- Regarding: schedule   ----- Message ----- From: Serafina Mitchell, MD Sent: 11/13/2015   9:08 AM To: V[facility] Charge Pool  11/13/2015:  Surgeon:  Annamarie Major Assistants:  Silva Bandy Procedure:   Left 1st stage basilic vein fistula   F/u 6 weeks with duplex

## 2015-11-14 NOTE — Telephone Encounter (Signed)
spoke to pt on home # for [place] 12/8 and F/U appt 12/11

## 2015-12-15 ENCOUNTER — Other Ambulatory Visit: Payer: Self-pay | Admitting: *Deleted

## 2015-12-15 DIAGNOSIS — I770 Arteriovenous fistula, acquired: Secondary | ICD-10-CM

## 2015-12-17 ENCOUNTER — Encounter: Payer: Self-pay | Admitting: Surgery

## 2015-12-19 ENCOUNTER — Encounter: Payer: Self-pay | Admitting: Surgery

## 2015-12-19 ENCOUNTER — Ambulatory Visit (HOSPITAL_COMMUNITY)
Admission: RE | Admit: 2015-12-19 | Discharge: 2015-12-19 | Disposition: A | Discharge status: Home / Self Care | Payer: Medicare Other | Source: Ambulatory Visit | Attending: Surgery | Admitting: Surgery

## 2015-12-19 DIAGNOSIS — I770 Arteriovenous fistula, acquired: Secondary | ICD-10-CM | POA: Diagnosis not present

## 2015-12-22 ENCOUNTER — Ambulatory Visit (INDEPENDENT_AMBULATORY_CARE_PROVIDER_SITE_OTHER): Payer: Medicare Other | Admitting: Surgery

## 2015-12-22 ENCOUNTER — Encounter: Payer: Self-pay | Admitting: Surgery

## 2015-12-22 VITALS — BP 146/69 | HR 78 | Temp 97.9°F | Resp 24 | Ht 66.0 in | Wt 200.0 lb

## 2015-12-22 DIAGNOSIS — N184 Chronic kidney disease, stage 4 (severe): Secondary | ICD-10-CM

## 2015-12-22 DIAGNOSIS — Z48812 Encounter for surgical aftercare following surgery on the circulatory system: Secondary | ICD-10-CM | POA: Insufficient documentation

## 2015-12-22 NOTE — Progress Notes (Signed)
   Patient name: Donna Ochoa MRN: [id number] DOB: 1933-01-17 Sex: female  REASON FOR VISIT: post-op  HPI: Donna Ochoa is a 82 y.o. female who returns for her first postoperative visit.  She is status post first stage left basilic vein procedure on 11/13/2015.  She did have some issues with numbness and coolness in her left hand after the procedure, however these have significantly improved.  Current Outpatient Prescriptions  Medication Sig Dispense Refill  . amLODipine (NORVASC) 5 MG tablet Take 5 mg by mouth daily.    Marland Kitchen aspirin EC 81 MG tablet Take 81 mg by mouth daily.    Marland Kitchen atorvastatin (LIPITOR) 80 MG tablet Take 1 tablet (80 mg total) by mouth at bedtime. 30 tablet 6  . ergocalciferol (VITAMIN D2) 50000 units capsule Take 50,000 Units by mouth once a week. mondays    . furosemide (LASIX) 40 MG tablet Take 40 mg by mouth daily.    . insulin glargine (LANTUS) 100 UNIT/ML injection Inject 40 Units into the skin at bedtime.     . insulin lispro (HUMALOG) 100 UNIT/ML injection Inject 20 Units into the skin 2 (two) times daily. With breakfast and lunch    . metoprolol succinate (TOPROL-XL) 25 MG 24 hr tablet Take 25 mg by mouth daily.    . nitroGLYCERIN (NITROSTAT) 0.4 MG SL tablet Place 0.4 mg under the tongue every 5 (five) minutes as needed for chest pain.    Marland Kitchen oxyCODONE-acetaminophen (PERCOCET/ROXICET) 5-325 MG tablet Take 1 tablet by mouth every 6 (six) hours as needed. 6 tablet 0  . sodium bicarbonate 650 MG tablet Take 1,300 mg by mouth 2 (two) times daily.     No current facility-administered medications for this visit.     REVIEW OF SYSTEMS:  [X]  denotes positive finding, [ ]  denotes negative finding Cardiac  Comments:  Chest pain or chest pressure:    Shortness of breath upon exertion:    Short of breath when lying flat:    Irregular heart rhythm:    Constitutional    Fever or chills:      PHYSICAL EXAM: Vitals:   12/22/15 1137  12/22/15 1145  BP: (!) 152/66 (!) 146/69  Pulse: 78   Resp: (!) 24   Temp: 97.9 F (36.6 C)   TempSrc: Oral   SpO2: 96%   Weight: 200 lb (90.7 kg)   Height: 5\' 6"  (1.676 m)     GENERAL: The patient is a well-nourished female, in no acute distress. The vital signs are documented above. CARDIOVASCULAR: There is a regular rate and rhythm. PULMONARY: Non-labored respirations Palpable thrill within fistula.  1+ left radial pulse  MEDICAL ISSUES: Duplex reveals an excellent caliber vein, the most of which is greater than 0.7 mm.  There is a short area near the anastomosis where the vein caliber is small measuring 0.35 cm.  I discussed proceeding with a second stage left basilic vein procedure.  I will need to re-tunnel this more anteriorly.  I will have to be careful about redoing the anastomosis as she did have some mild steal syndrome after her first operation.  Currently the vein is narrowed near the arterial venous anastomosis.  I would need to make sure that the arterial venous anastomosis stays at least this same size.  Her operations been scheduled for Friday, January 5  Annamarie Major, MD [facility] [phone] Pager [phone]

## 2015-12-22 NOTE — Progress Notes (Signed)
Vitals:   12/22/15 1137 12/22/15 1145  BP: (!) 152/66 (!) 146/69  Pulse: 78   Resp: (!) 24   Temp: 97.9 F (36.6 C)   TempSrc: Oral   SpO2: 96%   Weight: 200 lb (90.7 kg)   Height: 5\' 6"  (1.676 m)

## 2015-12-26 ENCOUNTER — Other Ambulatory Visit: Payer: Self-pay | Admitting: *Deleted

## 2015-12-26 ENCOUNTER — Other Ambulatory Visit (HOSPITAL_COMMUNITY): Payer: Medicare Other

## 2015-12-26 DIAGNOSIS — M79642 Pain in left hand: Secondary | ICD-10-CM

## 2015-12-31 ENCOUNTER — Other Ambulatory Visit: Payer: Self-pay

## 2016-01-08 ENCOUNTER — Telehealth: Payer: Self-pay | Admitting: Cardiology

## 2016-01-08 NOTE — Telephone Encounter (Signed)
Patient complaining of SOB in the past and swelling in her feet. Last weight this morning 195 12/26  198   [phone] cell if not home

## 2016-01-08 NOTE — Telephone Encounter (Signed)
Pt says swelling and SOB X 1 month has tried nebulizer machine - and taking lasix 40 mg daily - denies dizziness/CP - home health nurse advised pt to call cardiologist - says she is much better today - has lost 3lbs - says if SOB returns she will report to urgent care or ED - just wanted to make Dr. Harl Bowie aware per home health nurse - will forward to covering provider and Dr. Harl Bowie

## 2016-01-15 ENCOUNTER — Encounter (HOSPITAL_COMMUNITY): Payer: Self-pay | Admitting: Certified Registered Nurse Anesthetist

## 2016-01-15 NOTE — Progress Notes (Signed)
Tried numerous times to reach pt by phone, no answer and no voicemail available for her. Tried her emergency contact #'s and left message but no return call from them. I left pre-op instructions on pt's emergency contact by the name of Andres Escandon. Instructed Blanch Media to have pt take 1/2 of her regular dose of Lantus Insulin tonight (will take 20 units)and not to take her her Humalog insulin in the AM. Instructed her to have pt check her blood sugar in the AM. If blood sugar is 70 or below, treat with 1/2 cup of clear juice (apple or cranberry) and recheck blood sugar 15 minutes after drinking juice. If blood sugar continues to be 70 or below, call the Short Stay department and ask to speak to a nurse. Instructed them to arrive at 5:30 AM.

## 2016-01-16 ENCOUNTER — Ambulatory Visit (HOSPITAL_COMMUNITY): Admission: RE | Admit: 2016-01-16 | Payer: Medicare Other | Source: Ambulatory Visit | Admitting: Surgery

## 2016-01-16 SURGERY — TRANSPOSITION, VEIN, BASILIC
Anesthesia: General | Site: Arm Upper | Laterality: Left

## 2016-01-23 ENCOUNTER — Ambulatory Visit (INDEPENDENT_AMBULATORY_CARE_PROVIDER_SITE_OTHER): Payer: Medicare Other | Admitting: Adult Health

## 2016-01-23 ENCOUNTER — Telehealth: Payer: Self-pay | Admitting: *Deleted

## 2016-01-23 ENCOUNTER — Encounter: Payer: Self-pay | Admitting: Adult Health

## 2016-01-23 VITALS — BP 120/58 | HR 77 | Ht 66.0 in | Wt 195.0 lb

## 2016-01-23 DIAGNOSIS — J869 Pyothorax without fistula: Secondary | ICD-10-CM

## 2016-01-23 DIAGNOSIS — I5032 Chronic diastolic (congestive) heart failure: Secondary | ICD-10-CM

## 2016-01-23 DIAGNOSIS — I10 Essential (primary) hypertension: Secondary | ICD-10-CM | POA: Diagnosis not present

## 2016-01-23 MED ORDER — GUAIFENESIN ER 600 MG PO TB12: 1200.0000 mg | ORAL_TABLET | Freq: Two times a day (BID) | ORAL | 11 refills | Status: DC

## 2016-01-23 NOTE — Telephone Encounter (Signed)
Patient no showed for her Left 2nd stage BVT on 01-16-16. I called her today to reschedule this surgery but she did not want to set this up right now. Patient stated "she had heart and nerve doctors running her all around and she didn't have time to have surgery right now." I gave her our surgery scheduling phone number and told her to call us back to reschedule. I also told her that I would contact Dr. Florentina Addison office to let him be aware of this. I spoke with Dr. Florentina Addison staff and they were made aware that we had tried several times to set up this 2nd stage BVT.  I place her surgery form in our pending file until either the patient or Dr. Lowanda Foster calls [place] back.   Patient voiced understanding that failure to have this 2nd stage BVT may result in her having to get a dialysis catheter if she should have acute renal failure. She is in agreement and will call us back when she wants to have this surgery.

## 2016-01-23 NOTE — Patient Instructions (Signed)
Your physician recommends that you schedule a follow-up appointment in: 4-6 Weeks with Dr. Harl Bowie   Your physician has recommended you make the following change in your medication:  Start Mucinex 1200 mg Two Times Daily   Your physician recommends that you return for lab work in: Just Before your next visit  Your physician has requested that you have an echocardiogram. Echocardiography is a painless test that uses sound waves to create images of your heart. It provides your doctor with information about the size and shape of your heart and how well your heart's chambers and valves are working. This procedure takes approximately one hour. There are no restrictions for this procedure.  If you need a refill on your cardiac medications before your next appointment, please call your pharmacy.  Thank you for choosing [facility]!

## 2016-01-23 NOTE — Progress Notes (Signed)
Cardiology Office Note   Date:  01/23/2016   ID:  Donna Ochoa, DOB September 26, 1933, MRN [id number]  PCP:  Glenda Chroman, MD  Cardiologist:Branch/ Jory Sims, NP   No chief complaint on file.     History of Present Illness: Donna Ochoa is a 83 y.o. female who presents for ongoing assessment and management of coronary artery disease, with non-ST elevation MI in April 2016, drug-eluting stent to the left circumflex. Other history includes COPD, anemia, and chronic kidney disease stage IV. On last office visit with Dr. Harl Bowie on 09/17/2015 she was clinically stable.   This being seen last she has been seen by Dr. Trula Slade, for venous access for dialysis.  She was recently discharged from [facility] in the setting of decompensated CHF with upper respiratory infection. She was treated with IV diuresis and sent home on Lasix 40 mg daily. She is not being followed by a pulmonologist but has had continued episodes of wheezing and coughing. She's been using her nebulizer treatment at home fairly often. She states her breathing status has improved with the use of nebulizer treatments. She continues to have some upper airway wheezing at times. She is medically compliant. She does complain of some mild lower extremity ankle edema which has been chronic for her.  Past Medical History:  Diagnosis Date  . Anemia   . Arthritis   . Asthma   . Chronic kidney disease   . Coronary artery disease 04/2014   1 stent  . Diabetes mellitus without complication ([place])    type 2  . Hypertension   . Pneumonia     Past Surgical History:  Procedure Laterality Date  . ABDOMINAL HYSTERECTOMY  1975  . AV FISTULA PLACEMENT Left 11/13/2015   Procedure: ARTERIOVENOUS (AV) FISTULA CREATION UPPER ARM LEFT;  Surgeon: Serafina Mitchell, MD;  Location: [facility];  Service: Vascular;  Laterality: Left;  . COLONOSCOPY    . CORONARY ANGIOPLASTY WITH STENT PLACEMENT    . ROTATOR CUFF REPAIR Right      Current  Outpatient Prescriptions  Medication Sig Dispense Refill  . amLODipine (NORVASC) 5 MG tablet Take 5 mg by mouth daily.    Marland Kitchen aspirin EC 81 MG tablet Take 81 mg by mouth daily.    Marland Kitchen atorvastatin (LIPITOR) 80 MG tablet Take 1 tablet (80 mg total) by mouth at bedtime. 30 tablet 6  . ergocalciferol (VITAMIN D2) 50000 units capsule Take 50,000 Units by mouth once a week. mondays    . furosemide (LASIX) 40 MG tablet Take 40 mg by mouth daily.    . insulin glargine (LANTUS) 100 UNIT/ML injection Inject 40 Units into the skin at bedtime.     . insulin lispro (HUMALOG) 100 UNIT/ML injection Inject 2 Units into the skin 2 (two) times daily. With breakfast and lunch     . metoprolol succinate (TOPROL-XL) 25 MG 24 hr tablet Take 25 mg by mouth daily.    . nitroGLYCERIN (NITROSTAT) 0.4 MG SL tablet Place 0.4 mg under the tongue every 5 (five) minutes as needed for chest pain.    . sodium bicarbonate 650 MG tablet Take 1,300 mg by mouth 2 (two) times daily.     No current facility-administered medications for this visit.     Allergies:   Codeine and Tramadol    Social History:  The patient  reports that she has never smoked. She has never used smokeless tobacco. She reports that she does not drink alcohol or use drugs.  Family History:  The patient's family history includes Diabetes in her brother, brother, father, maternal grandfather, maternal grandmother, mother, paternal grandfather, paternal grandmother, sister, and sister; Heart disease in her brother; Heart failure in her sister.    ROS: All other systems are reviewed and negative. Unless otherwise mentioned in H&P    PHYSICAL EXAM: VS:  BP (!) 120/58   Pulse 77   Ht 5\' 6"  (1.676 m)   Wt 195 lb (88.5 kg)   SpO2 99%   BMI 31.47 kg/m  , BMI Body mass index is 31.47 kg/m. GEN: Well nourished, well developed, in no acute distress  HEENT: normal  Neck: no JVD, carotid bruits, or masses Cardiac: RRR; no murmurs, rubs, or gallops, nonpitting  ankle edema  Respiratory:  Upper airway wheezing, otherwise clear with mild scant Bibasilar crackles GI: soft, nontender, nondistended, + BS MS: no deformity or atrophy  Skin: warm and dry, no rash Neuro:  Strength and sensation are intact Psych: euthymic mood, full affect  Recent Labs: 11/13/2015: Hemoglobin 10.9; Potassium 4.5; Sodium 141    Lipid Panel No results found for: CHOL, TRIG, HDL, CHOLHDL, VLDL, LDLCALC, LDLDIRECT    Wt Readings from Last 3 Encounters:  01/23/16 195 lb (88.5 kg)  12/22/15 200 lb (90.7 kg)  11/13/15 198 lb (89.8 kg)      Other studies Reviewed: 02/2004 cath HEMODYNAMIC DATA: 1. Central aortic pressure 128/75, mean 96. 2. Left ventricle 135/8. 3. No gradient pullback across aortic valve.  ANGIOGRAPHIC DATA: 1. Ventriculography was done in the RAO projection. Overall systolic function was vigorous. No segmental abnormalities or contraction were identified. 2. The left main coronary artery was free of critical disease and was very large caliber, being at least about 6 mm. 3. The left anterior descending artery courses to the apex. The LAD has two fairly small diagonal branches a major septal perforator. In the mid vessel the vessel appears to take an intramyocardial course and a distal vessel wraps the apex. At the very proximal portion of the left anterior descending artery there is an eccentric plaque. This probably ranges in the range of about 40-50%. It does not appear to be flow limiting and we took multiple views of this in multiple angiographic projections. My estimate would be that it would not likely be flow-limiting. 4. There is a fairly large ramus intermedius vessel. This has about 40% ostial narrowing and then the mid vessel appears to be acentric with about 50% narrowing. 5. The circumflex is a dominant vessel. There is 50 to at most 60% narrowing  in the mid vessel. This is very eccentric and smooth with a residual lumen of greater than 2 mm likely, again, not representing flow- limiting disease. 6. The right coronary artery is a nondominant vessel and appears to be nonobstructive.  CONCLUSION: 1. Well-preserved left ventricular function. 2. Multiple lesions of the coronary arteries with none appearing to be high grade or flow-limiting in nature.  RECOMMENDATIONS: Without question the patient has evidence of coronary atherosclerosis. The vessels are very large in caliber. The mid LAD would be estimated at at least about 4 mm in size. Importantly, there are multiple areas of plaquing but none of these appear to be high grade. Based on this, aggressive risk factor reduction would be recommended. Fortunately, the patient does not smoke. Optimization of her hemoglobin A1C and control of lipids would be appropriate therapy at this time. I have discussed this case with Dr. Dannielle Burn and we will review again in further  detail.   [facility] cath 04/2014 CORONARY ANGIOGRAPHY: Culprit vessel intitial: 95% stenosis in the proximal aspect of the lesion 75% stenosis in the mid to distal aspect of the lesion. Lesion length 15 mm. Disease segment 18-20 mm. Type BII lesion. Initial flow was TIMI-3 flow. Culprit vessel post intervention: No residual stenosis. Good stent apposition. TIMI 3 flow.  Echocardiogram: 10/2014 (Transceribed from scanned document) The left ventricle chamber size is normal. Mild concentric left ventricular hypertrophy is observed. Global left ventricular wall motion and contractility are within normal limits. There is normal left ventricular systolic function. The estimated ejection fraction is 60-65%. Abnormal left ventricular diastolic function is observed. Left ventricular diastolic filling pattern is consistent with pseudonormalization, grade 2 diastolic dysfunction.  ASSESSMENT AND  PLAN:  1.  Chronic diastolic CHF: Recent hospital admission for same with upper respiratory congestion. The patient was diuresed. Weight now 195 pounds. She continues to have some mild lower extremity edema but significantly improved since discharge. We'll continue Lasix 40 mg daily. Follow-up BMET. Daily weights and low sodium diet are discussed and she is given written instructions on low sodium. Will order repeat her echocardiogram as it has not been completed since 2016 for changes in LV systolic function.   2. COPD: The patient states she's been still having some episodes of wheezing. She is on metoprolol but I am not hearing any wheezing at this time. She states he gets better with use of albuterol nebulizer treatments. She has not yet filled her Abreva inhaler. She is not being followed by a pulmonologist at this time. She is requesting referral. I am sending her to Dr. Luan Pulling in [place] as this is easier for her to travel to his office. Have advised her to fill her prescription for Abreva. I'm giving her a prescription for Mucinex 1200 mg twice a day.  3. Hypertension: Excellent control of blood pressure at this time. We'll continue amlodipine, Lasix, and metoprolol. Follow-up BMET prior to office visit with Dr.Branch.   Current medicines are reviewed at length with the patient today.    Labs/ tests ordered today include: BMET   Disposition:   FU with Dr. Gerrit Halls one month in [place].   Signed, Jory Sims, NP  01/23/2016 4:02 PM    [facility] [address] [place], [place] [address] Phone: [phone]; Fax: [phone]
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
## 2016-01-28 ENCOUNTER — Ambulatory Visit: Payer: Medicare Other | Admitting: Diagnostic Neuroimaging

## 2016-02-05 ENCOUNTER — Other Ambulatory Visit: Payer: Self-pay | Admitting: Adult Health

## 2016-02-05 DIAGNOSIS — R0602 Shortness of breath: Secondary | ICD-10-CM

## 2016-02-13 ENCOUNTER — Ambulatory Visit: Payer: Medicare Other | Admitting: Diagnostic Neuroimaging

## 2016-02-18 ENCOUNTER — Ambulatory Visit (INDEPENDENT_AMBULATORY_CARE_PROVIDER_SITE_OTHER): Payer: Medicare Other

## 2016-02-18 ENCOUNTER — Other Ambulatory Visit: Payer: Self-pay

## 2016-02-18 DIAGNOSIS — R0602 Shortness of breath: Secondary | ICD-10-CM | POA: Diagnosis not present

## 2016-02-19 ENCOUNTER — Telehealth: Payer: Self-pay | Admitting: *Deleted

## 2016-02-19 NOTE — Telephone Encounter (Signed)
-----   Message from Lendon Colonel, NP sent at 02/19/2016  9:12 AM EST ----- Normal pumping function with some stiffening on relaxation. Will need to keep BP controlled. She did have some high lung pressures from her COPD. Will be seeing Dr. Luan Pulling for this.

## 2016-02-19 NOTE — Telephone Encounter (Signed)
Called patient with test results. No answer. Unable to leave message.  

## 2016-02-23 ENCOUNTER — Ambulatory Visit (INDEPENDENT_AMBULATORY_CARE_PROVIDER_SITE_OTHER): Payer: Medicare Other | Admitting: Otolaryngology

## 2016-02-23 DIAGNOSIS — H903 Sensorineural hearing loss, bilateral: Secondary | ICD-10-CM | POA: Diagnosis not present

## 2016-02-24 ENCOUNTER — Telehealth: Payer: Self-pay | Admitting: Cardiology

## 2016-02-24 NOTE — Telephone Encounter (Signed)
Whitney from [facility] made aware of pt EF and last BP and HR at LOV

## 2016-02-24 NOTE — Telephone Encounter (Signed)
Whitney with [facility] called requesting information on Donna Ochoa's recent echo report. Please call [phone] ext [phone].

## 2016-03-09 ENCOUNTER — Encounter: Payer: Self-pay | Admitting: *Deleted

## 2016-03-10 ENCOUNTER — Ambulatory Visit: Payer: Medicare Other | Admitting: Diagnostic Neuroimaging

## 2016-03-22 ENCOUNTER — Ambulatory Visit: Payer: Medicare Other | Admitting: Diagnostic Neuroimaging

## 2016-03-24 ENCOUNTER — Other Ambulatory Visit: Payer: Self-pay | Admitting: *Deleted

## 2016-03-29 ENCOUNTER — Ambulatory Visit: Payer: Medicare Other | Admitting: Cardiology

## 2016-03-29 ENCOUNTER — Encounter (HOSPITAL_COMMUNITY): Payer: Self-pay | Admitting: *Deleted

## 2016-03-29 NOTE — Progress Notes (Addendum)
I instructed patient to check CBG to check CBG and if it is less than 70 to treat it with Glucose Gel, Glucose tablets or 1/2 cup of clear juice like apple juice or cranberry juice, or 1/2 cup of regular soda. (not cream soda). I instructed patient to recheck CBG in 15 minutes and if CBG is not greater than 70, to  Call [phone] (pre- op). If it is before pre-op opens to retreat as before and recheck CBG in 15 minutes. I told patient to make note of time that liquid is taken and amount, that surgical time may have to be adjusted.   I instructed patient that if CBG > 220 to take 1/2 usual dose of Insulin.  I instructed patient to eat a snack before bedtime tonight. Donna Ochoa reports that her CBGs run in the low 100's . Diabetes is managed by PCP, states she is behind on having A1C checked, has an appointment in April. Donna Ochoa denies chest pain, states that she does not have shortness of breath now, "I did before they increased my fluid pills, but not now."

## 2016-03-30 ENCOUNTER — Encounter (HOSPITAL_COMMUNITY)
Admission: RE | Disposition: A | Discharge status: Home / Self Care | Payer: Self-pay | Source: Ambulatory Visit | Attending: Vascular Surgery

## 2016-03-30 ENCOUNTER — Ambulatory Visit (HOSPITAL_COMMUNITY): Payer: Medicare Other | Admitting: Certified Registered Nurse Anesthetist

## 2016-03-30 ENCOUNTER — Encounter (HOSPITAL_COMMUNITY): Payer: Self-pay | Admitting: *Deleted

## 2016-03-30 ENCOUNTER — Ambulatory Visit (HOSPITAL_COMMUNITY)
Admission: RE | Admit: 2016-03-30 | Discharge: 2016-03-30 | Disposition: A | Discharge status: Home / Self Care | Payer: Medicare Other | Source: Ambulatory Visit | Attending: Vascular Surgery | Admitting: Vascular Surgery

## 2016-03-30 DIAGNOSIS — I252 Old myocardial infarction: Secondary | ICD-10-CM | POA: Diagnosis not present

## 2016-03-30 DIAGNOSIS — N184 Chronic kidney disease, stage 4 (severe): Secondary | ICD-10-CM | POA: Diagnosis not present

## 2016-03-30 DIAGNOSIS — Z955 Presence of coronary angioplasty implant and graft: Secondary | ICD-10-CM | POA: Diagnosis not present

## 2016-03-30 DIAGNOSIS — Z7982 Long term (current) use of aspirin: Secondary | ICD-10-CM | POA: Diagnosis not present

## 2016-03-30 DIAGNOSIS — E1122 Type 2 diabetes mellitus with diabetic chronic kidney disease: Secondary | ICD-10-CM | POA: Diagnosis not present

## 2016-03-30 DIAGNOSIS — I2510 Atherosclerotic heart disease of native coronary artery without angina pectoris: Secondary | ICD-10-CM | POA: Insufficient documentation

## 2016-03-30 DIAGNOSIS — I129 Hypertensive chronic kidney disease with stage 1 through stage 4 chronic kidney disease, or unspecified chronic kidney disease: Secondary | ICD-10-CM | POA: Diagnosis not present

## 2016-03-30 DIAGNOSIS — Z79899 Other long term (current) drug therapy: Secondary | ICD-10-CM | POA: Insufficient documentation

## 2016-03-30 DIAGNOSIS — Z794 Long term (current) use of insulin: Secondary | ICD-10-CM | POA: Insufficient documentation

## 2016-03-30 HISTORY — PX: BASCILIC VEIN TRANSPOSITION: SHX5742

## 2016-03-30 HISTORY — DX: Dyspnea, unspecified: R06.00

## 2016-03-30 HISTORY — DX: Unspecified intracranial injury with loss of consciousness of unspecified duration, initial encounter: S06.9X9A

## 2016-03-30 HISTORY — DX: Personal history of other medical treatment: Z92.89

## 2016-03-30 LAB — POCT I-STAT 4, (NA,K, GLUC, HGB,HCT)
GLUCOSE: 254 mg/dL — AB (ref 65–99)
HEMATOCRIT: 31 % — AB (ref 36.0–46.0)
Hemoglobin: 10.5 g/dL — ABNORMAL LOW (ref 12.0–15.0)
Potassium: 4 mmol/L (ref 3.5–5.1)
Sodium: 142 mmol/L (ref 135–145)

## 2016-03-30 LAB — GLUCOSE, CAPILLARY: GLUCOSE-CAPILLARY: 226 mg/dL — AB (ref 65–99)

## 2016-03-30 SURGERY — TRANSPOSITION, VEIN, BASILIC
Anesthesia: General | Laterality: Left

## 2016-03-30 MED ORDER — FENTANYL CITRATE (PF) 100 MCG/2ML IJ SOLN
INTRAMUSCULAR | Status: DC | PRN
  Administered 2016-03-30: 50 ug via INTRAVENOUS
  Administered 2016-03-30 (×3): 25 ug via INTRAVENOUS

## 2016-03-30 MED ORDER — FENTANYL CITRATE (PF) 100 MCG/2ML IJ SOLN: 25.0000 ug | INTRAMUSCULAR | Status: DC | PRN

## 2016-03-30 MED ORDER — 0.9 % SODIUM CHLORIDE (POUR BTL) OPTIME
TOPICAL | Status: DC | PRN
  Administered 2016-03-30: 1000 mL

## 2016-03-30 MED ORDER — ONDANSETRON HCL 4 MG/2ML IJ SOLN
INTRAMUSCULAR | Status: DC | PRN
  Administered 2016-03-30: 4 mg via INTRAVENOUS

## 2016-03-30 MED ORDER — SODIUM CHLORIDE 0.9 % IV SOLN
INTRAVENOUS | Status: DC | PRN
  Administered 2016-03-30: 13:00:00

## 2016-03-30 MED ORDER — PROPOFOL 10 MG/ML IV BOLUS
INTRAVENOUS | Status: DC | PRN
  Administered 2016-03-30: 200 mg via INTRAVENOUS

## 2016-03-30 MED ORDER — LIDOCAINE HCL (CARDIAC) 20 MG/ML IV SOLN
INTRAVENOUS | Status: DC | PRN
  Administered 2016-03-30: 100 mg via INTRAVENOUS

## 2016-03-30 MED ORDER — PROPOFOL 10 MG/ML IV BOLUS
INTRAVENOUS | Status: AC
  Filled 2016-03-30: qty 20

## 2016-03-30 MED ORDER — FENTANYL CITRATE (PF) 100 MCG/2ML IJ SOLN
INTRAMUSCULAR | Status: AC
  Filled 2016-03-30: qty 2

## 2016-03-30 MED ORDER — PHENYLEPHRINE HCL 10 MG/ML IJ SOLN
INTRAMUSCULAR | Status: AC
  Filled 2016-03-30: qty 1

## 2016-03-30 MED ORDER — BUPIVACAINE HCL (PF) 0.5 % IJ SOLN
INTRAMUSCULAR | Status: AC
  Filled 2016-03-30: qty 30

## 2016-03-30 MED ORDER — DEXTROSE 5 % IV SOLN
1.5000 g | INTRAVENOUS | Status: AC
  Administered 2016-03-30: 1.5 g via INTRAVENOUS
  Filled 2016-03-30: qty 1.5

## 2016-03-30 MED ORDER — EPHEDRINE SULFATE 50 MG/ML IJ SOLN
INTRAMUSCULAR | Status: DC | PRN
  Administered 2016-03-30: 10 mg via INTRAVENOUS
  Administered 2016-03-30 (×2): 5 mg via INTRAVENOUS

## 2016-03-30 MED ORDER — SODIUM CHLORIDE 0.9 % IV SOLN
INTRAVENOUS | Status: DC
  Administered 2016-03-30 (×2): via INTRAVENOUS

## 2016-03-30 MED ORDER — PHENYLEPHRINE HCL 10 MG/ML IJ SOLN
INTRAMUSCULAR | Status: DC | PRN
  Administered 2016-03-30 (×2): 80 ug via INTRAVENOUS

## 2016-03-30 MED ORDER — TRAMADOL HCL 50 MG PO TABS: 50.0000 mg | ORAL_TABLET | Freq: Four times a day (QID) | ORAL | 0 refills | Status: DC | PRN

## 2016-03-30 MED ORDER — CHLORHEXIDINE GLUCONATE CLOTH 2 % EX PADS: 6.0000 | MEDICATED_PAD | Freq: Once | CUTANEOUS | Status: DC

## 2016-03-30 MED ORDER — PHENYLEPHRINE HCL 10 MG/ML IJ SOLN
INTRAVENOUS | Status: DC | PRN
  Administered 2016-03-30: 20 ug/min via INTRAVENOUS

## 2016-03-30 MED ORDER — LIDOCAINE HCL (PF) 1 % IJ SOLN
INTRAMUSCULAR | Status: AC
  Filled 2016-03-30: qty 30

## 2016-03-30 SURGICAL SUPPLY — 44 items
ARMBAND PINK RESTRICT EXTREMIT (MISCELLANEOUS) ×3 IMPLANT
CANISTER SUCT 3000ML PPV (MISCELLANEOUS) ×3 IMPLANT
CLIP TI MEDIUM 24 (CLIP) ×3 IMPLANT
CLIP TI WIDE RED SMALL 24 (CLIP) ×3 IMPLANT
COVER PROBE W GEL 5X96 (DRAPES) ×3 IMPLANT
DECANTER SPIKE VIAL GLASS SM (MISCELLANEOUS) ×3 IMPLANT
DERMABOND ADVANCED (GAUZE/BANDAGES/DRESSINGS)
DERMABOND ADVANCED .7 DNX12 (GAUZE/BANDAGES/DRESSINGS) IMPLANT
DRSG ADAPTIC 3X8 NADH LF (GAUZE/BANDAGES/DRESSINGS) IMPLANT
DRSG COVADERM 4X10 (GAUZE/BANDAGES/DRESSINGS) ×3 IMPLANT
ELECT REM PT RETURN 9FT ADLT (ELECTROSURGICAL) ×3
ELECTRODE REM PT RTRN 9FT ADLT (ELECTROSURGICAL) ×1 IMPLANT
GAUZE SPONGE 4X4 12PLY STRL (GAUZE/BANDAGES/DRESSINGS) IMPLANT
GLOVE BIO SURGEON STRL SZ7 (GLOVE) ×6 IMPLANT
GLOVE BIOGEL PI IND STRL 6.5 (GLOVE) ×1 IMPLANT
GLOVE BIOGEL PI IND STRL 7.5 (GLOVE) ×2 IMPLANT
GLOVE BIOGEL PI INDICATOR 6.5 (GLOVE) ×2
GLOVE BIOGEL PI INDICATOR 7.5 (GLOVE) ×4
GLOVE ECLIPSE 7.0 STRL STRAW (GLOVE) ×3 IMPLANT
GLOVE ECLIPSE 7.5 STRL STRAW (GLOVE) ×6 IMPLANT
GOWN STRL REUS W/ TWL LRG LVL3 (GOWN DISPOSABLE) ×3 IMPLANT
GOWN STRL REUS W/ TWL XL LVL3 (GOWN DISPOSABLE) ×1 IMPLANT
GOWN STRL REUS W/TWL LRG LVL3 (GOWN DISPOSABLE) ×6
GOWN STRL REUS W/TWL XL LVL3 (GOWN DISPOSABLE) ×2
HEMOSTAT SPONGE AVITENE ULTRA (HEMOSTASIS) ×6 IMPLANT
KIT BASIN OR (CUSTOM PROCEDURE TRAY) ×3 IMPLANT
KIT ROOM TURNOVER OR (KITS) ×3 IMPLANT
NS IRRIG 1000ML POUR BTL (IV SOLUTION) ×3 IMPLANT
PACK CV ACCESS (CUSTOM PROCEDURE TRAY) ×3 IMPLANT
PAD ARMBOARD 7.5X6 YLW CONV (MISCELLANEOUS) ×6 IMPLANT
STAPLER VISISTAT 35W (STAPLE) ×6 IMPLANT
SUT MNCRL AB 4-0 PS2 18 (SUTURE) ×6 IMPLANT
SUT PROLENE 6 0 BV (SUTURE) ×3 IMPLANT
SUT SILK 2 0 SH (SUTURE) ×3 IMPLANT
SUT SILK 3 0 (SUTURE) ×2
SUT SILK 3-0 18XBRD TIE 12 (SUTURE) ×1 IMPLANT
SUT SILK 4 0 (SUTURE) ×4
SUT SILK 4-0 18XBRD TIE 12 (SUTURE) ×2 IMPLANT
SUT VIC AB 2-0 CT1 27 (SUTURE) ×2
SUT VIC AB 2-0 CT1 TAPERPNT 27 (SUTURE) ×1 IMPLANT
SUT VIC AB 3-0 SH 27 (SUTURE) ×8
SUT VIC AB 3-0 SH 27X BRD (SUTURE) ×4 IMPLANT
UNDERPAD 30X30 (UNDERPADS AND DIAPERS) ×3 IMPLANT
WATER STERILE IRR 1000ML POUR (IV SOLUTION) ×3 IMPLANT

## 2016-03-30 NOTE — Anesthesia Postprocedure Evaluation (Signed)
Anesthesia Post Note  Patient: Donna Ochoa  Procedure(s) Performed: Procedure(s) (LRB): 2ND STAGE BASILIC VEIN TRANSPOSITION (Left)  Patient location during evaluation: PACU Anesthesia Type: General Level of consciousness: awake Pain management: pain level controlled Vital Signs Assessment: post-procedure vital signs reviewed and stable Respiratory status: spontaneous breathing Cardiovascular status: stable Anesthetic complications: no       Last Vitals:  Vitals:   03/30/16 1545 03/30/16 1550  BP:  (!) 150/59  Pulse: 62 62  Resp: 11 19  Temp:      Last Pain:  Vitals:   03/30/16 1033  TempSrc: Oral                 Syanne Looney

## 2016-03-30 NOTE — Anesthesia Preprocedure Evaluation (Addendum)
Anesthesia Evaluation  Patient identified by MRN, date of birth, ID band Patient awake    Reviewed: Allergy & Precautions, NPO status , Patient's Chart, lab work & pertinent test results  Airway Mallampati: II  TM Distance: >3 FB     Dental   Pulmonary shortness of breath, asthma , pneumonia,    breath sounds clear to auscultation       Cardiovascular hypertension, + angina + CAD and + Past MI   Rhythm:Regular Rate:Normal     Neuro/Psych    GI/Hepatic negative GI ROS, Neg liver ROS,   Endo/Other  diabetes  Renal/GU Renal disease     Musculoskeletal   Abdominal   Peds  Hematology  (+) anemia ,   Anesthesia Other Findings   Reproductive/Obstetrics                             Anesthesia Physical Anesthesia Plan  ASA: III  Anesthesia Plan: General   Post-op Pain Management:    Induction: Intravenous  Airway Management Planned: LMA  Additional Equipment:   Intra-op Plan:   Post-operative Plan: Extubation in OR  Informed Consent: I have reviewed the patients History and Physical, chart, labs and discussed the procedure including the risks, benefits and alternatives for the proposed anesthesia with the patient or authorized representative who has indicated his/her understanding and acceptance.   Dental advisory given  Plan Discussed with: CRNA, Anesthesiologist and Surgeon  Anesthesia Plan Comments:       Anesthesia Quick Evaluation
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
## 2016-03-30 NOTE — Transfer of Care (Signed)
Immediate Anesthesia Transfer of Care Note  Patient: Donna Ochoa  Procedure(s) Performed: Procedure(s): 2ND STAGE BASILIC VEIN TRANSPOSITION (Left)  Patient Location: PACU  Anesthesia Type:General  Level of Consciousness: awake, alert , oriented and patient cooperative  Airway & Oxygen Therapy: Patient Spontanous Breathing and Patient connected to face mask oxygen  Post-op Assessment: Report given to RN and Post -op Vital signs reviewed and stable  Post vital signs: Reviewed and stable  Last Vitals:  Vitals:   03/30/16 1033 03/30/16 1445  BP: (!) 178/59   Pulse: 76   Resp: 14   Temp: 36.8 C 36.6 C    Last Pain:  Vitals:   03/30/16 1033  TempSrc: Oral      Patients Stated Pain Goal: 3 (57/02/20 2669)  Complications: No apparent anesthesia complications

## 2016-03-30 NOTE — Progress Notes (Signed)
Dr. Nyoka Cowden made aware of pt's glucose 254 from IStat draw.  No new orders. Recheck in 2 hours per regular checks.

## 2016-03-30 NOTE — Anesthesia Procedure Notes (Signed)
Procedure Name: LMA Insertion Date/Time: 03/30/2016 1:03 PM Performed by: Shirlyn Goltz Pre-anesthesia Checklist: Patient identified, Emergency Drugs available, Suction available and Patient being monitored Patient Re-evaluated:Patient Re-evaluated prior to inductionOxygen Delivery Method: Circle system utilized Preoxygenation: Pre-oxygenation with 100% oxygen Intubation Type: IV induction Ventilation: Mask ventilation without difficulty LMA: LMA inserted LMA Size: 3.0 Number of attempts: 2 (could not get LMA #4 to seat) Placement Confirmation: positive ETCO2 and breath sounds checked- equal and bilateral Tube secured with: Tape Dental Injury: Teeth and Oropharynx as per pre-operative assessment

## 2016-03-30 NOTE — Op Note (Signed)
    OPERATIVE NOTE   PROCEDURE: Left second stage basilic vein transposition (brachiobasilic arteriovenous fistula) placement  PRE-OPERATIVE DIAGNOSIS: chronic kidney disease stage IV   POST-OPERATIVE DIAGNOSIS: same as above   SURGEON: Adele Barthel, MD  ASSISTANT(S): Gerri Lins, PAC   ANESTHESIA: general  ESTIMATED BLOOD LOSS: 50 cc  FINDING(S): 1.  Fistula >6 mm throughout 2.  Enters into brachial vein in mid-arm    SPECIMEN(S):  none  INDICATIONS:   Donna Ochoa is a 83 y.o. female who presents with chronic kidney disease stage IV.  The patient is scheduled for left second stage basilic vein transposition.  The patient is aware the risks include but are not limited to: bleeding, infection, steal syndrome, nerve damage, ischemic monomelic neuropathy, failure to mature, and need for additional procedures.  The patient is aware of the risks of the procedure and elects to proceed forward.   DESCRIPTION: After full informed written consent was obtained from the patient, the patient was brought back to the operating room and placed supine upon the operating table.  Prior to induction, the patient received IV antibiotics.   After obtaining adequate anesthesia, the patient was then prepped and draped in the standard fashion for a left arm access procedure.    I turned my attention first to identifying the patient's brachiobasilic arteriovenous fistula.  Using SonoSite guidance, the location of this fistula was marked out on the skin.  I made an longitudinal incision over the fistula from its arterial anastomosis up to its axillary extent.  I carefully dissected the fistula away from its adjacent nerves.  Eventually the entirety of this fistula was mobilized and I dissected a plane on top of the bicipital fascia with electrocautery.  The fistula was secured in its new location with loosely placed interrupted 3-0 Vicryl stitches tied to side branches and the fascia.  The deep  subcutaneous tissue was inspected for bleeding.    Bleeding was controlled with electrocautery and placement of large pieces of Avitene.  I washed out the surgical site after waiting a few minutes, and there was no further bleeding.  The fascia was reapproximated with interrupted stitches of 2-0 Vicryl to eliminate some of the deep space.  The superficial subcutaneous tissue was then reapproximated along the incision line with a running stitch of 3-0 Vicryl.  The skin was then reapproximated with staples.  The skin was then cleaned, dried, and dressing with a Coverall.    The patient tolerated this procedure well.    COMPLICATIONS: none  CONDITION: stable   Adele Barthel, MD, [facility] [place] Office: [phone] Pager: [phone]  03/30/2016, 2:39 PM

## 2016-03-31 ENCOUNTER — Encounter (HOSPITAL_COMMUNITY): Payer: Self-pay | Admitting: Vascular Surgery

## 2016-03-31 ENCOUNTER — Telehealth: Payer: Self-pay | Admitting: Vascular Surgery

## 2016-03-31 NOTE — Telephone Encounter (Signed)
Sched appt 04/30/16 at 1:45. Spoke to pt to inform pt of appt.

## 2016-03-31 NOTE — Telephone Encounter (Signed)
-----   Message from Denman George, RN sent at 03/30/2016  5:09 PM EDT ----- Regarding: needs 4 week f/u with Dr. Bridgett Larsson   ----- Message ----- From: Ulyses Amor, PA-C Sent: 03/30/2016   3:05 PM To: [facility] Charge Pool  F/U with Dr. Bridgett Larsson in 4 weeks s/p transposition fistula

## 2016-04-02 NOTE — Addendum Note (Signed)
Addendum  created 04/02/16 2015 by Belinda Block, MD   Sign clinical note

## 2016-04-13 ENCOUNTER — Encounter: Payer: Self-pay | Admitting: Cardiology

## 2016-04-13 ENCOUNTER — Ambulatory Visit (INDEPENDENT_AMBULATORY_CARE_PROVIDER_SITE_OTHER): Payer: Medicare Other | Admitting: Cardiology

## 2016-04-13 ENCOUNTER — Encounter: Payer: Self-pay | Admitting: *Deleted

## 2016-04-13 VITALS — BP 173/70 | HR 85 | Ht 66.0 in | Wt 190.6 lb

## 2016-04-13 DIAGNOSIS — I10 Essential (primary) hypertension: Secondary | ICD-10-CM

## 2016-04-13 DIAGNOSIS — I2510 Atherosclerotic heart disease of native coronary artery without angina pectoris: Secondary | ICD-10-CM

## 2016-04-13 DIAGNOSIS — N184 Chronic kidney disease, stage 4 (severe): Secondary | ICD-10-CM | POA: Diagnosis not present

## 2016-04-13 DIAGNOSIS — E782 Mixed hyperlipidemia: Secondary | ICD-10-CM | POA: Diagnosis not present

## 2016-04-13 MED ORDER — AMLODIPINE BESYLATE 10 MG PO TABS: 10.0000 mg | ORAL_TABLET | Freq: Every day | ORAL | 1 refills | Status: DC

## 2016-04-13 NOTE — Patient Instructions (Signed)
Your physician wants you to follow-up in: [place] will receive a reminder letter in the mail two months in advance. If you don't receive a letter, please call our office to schedule the follow-up appointment.  Your physician has recommended you make the following change in your medication:   INCREASE AMLODIPINE 10 MG DAILY  Thank you for choosing [facility]!!

## 2016-04-13 NOTE — Progress Notes (Signed)
Clinical Summary Donna Ochoa is a 83 y.o.female seen today for follow up of the following medical problems.   1. CAD - NSTEMI 04/2014 at [place], received DES to LCX. Echo at that time according to notes with normal LVEF.  - ASA allergy,notes indicate desensitization during 04/2014 admission at [facility]. Now taking ASA at home.   - no recent chest pain. No SOB or DOE.  - compliant with meds.  - upcoming labs with pcp  2. COPD - compliant with inhalers.   3. Anemia - followed by pcp Dr Woody Seller.   4. CKD IV - followed by nephorology   5. Chronic diastolic HF - echo 02/6832 LVEF 55%, grade II diastolic dysfunction, PASP 62, normal RV - occasional LE edema at times. Limiting salt intake.  - compliant with diuretics  6. HTN - compliant with meds      Past Medical History:  Diagnosis Date  . Anemia   . Arthritis   . Asthma   . Chronic kidney disease    [facility]  . Coronary artery disease 04/2014   1 stent  . Diabetes mellitus without complication ([place])    type 2  . Dyspnea    when she has too much fluid  . Head injury with loss of consciousness ([place]) 2014  . History of blood transfusion   . Hypertension   . Pneumonia    03/29/16- years ago     Allergies  Allergen Reactions  . Codeine Shortness Of Breath and Nausea And Vomiting  . Tramadol Nausea And Vomiting     Current Outpatient Prescriptions  Medication Sig Dispense Refill  . amLODipine (NORVASC) 5 MG tablet Take 5 mg by mouth daily.    Marland Kitchen aspirin EC 81 MG tablet Take 81 mg by mouth daily.    Marland Kitchen atorvastatin (LIPITOR) 80 MG tablet Take 1 tablet (80 mg total) by mouth at bedtime. 30 tablet 6  . calcitRIOL (ROCALTROL) 0.25 MCG capsule Take 0.25 mcg by mouth daily.    . ergocalciferol (VITAMIN D2) 50000 units capsule Take 50,000 Units by mouth once a week. Mondays    . fluticasone furoate-vilanterol (BREO ELLIPTA) 100-25 MCG/INH AEPB Inhale 1 puff into the lungs daily as needed (shortness of  breath).    . furosemide (LASIX) 40 MG tablet Take 40 mg by mouth 2 (two) times daily with a meal.     . insulin glargine (LANTUS) 100 UNIT/ML injection Inject 40 Units into the skin at bedtime.     . insulin lispro (HUMALOG) 100 UNIT/ML injection Inject 20 Units into the skin 2 (two) times daily with breakfast and lunch. With breakfast and lunch     . metoprolol succinate (TOPROL-XL) 25 MG 24 hr tablet Take 25 mg by mouth daily.    . nitroGLYCERIN (NITROSTAT) 0.4 MG SL tablet Place 0.4 mg under the tongue every 5 (five) minutes as needed for chest pain.    . sodium bicarbonate 650 MG tablet Take 1,300 mg by mouth 2 (two) times daily.    . traMADol (ULTRAM) 50 MG tablet Take 1 tablet (50 mg total) by mouth every 6 (six) hours as needed. 20 tablet 0   No current facility-administered medications for this visit.      Past Surgical History:  Procedure Laterality Date  . ABDOMINAL HYSTERECTOMY  1975  . AV FISTULA PLACEMENT Left 11/13/2015   Procedure: ARTERIOVENOUS (AV) FISTULA CREATION UPPER ARM LEFT;  Surgeon: Serafina Mitchell, MD;  Location: [place];  Service:  Vascular;  Laterality: Left;  . BASCILIC VEIN TRANSPOSITION Left 03/30/2016   Procedure: 2ND STAGE BASILIC VEIN TRANSPOSITION;  Surgeon: Conrad Parcelas Viejas Bor[place]en, MD;  Location: [place];  Service: Vascular;  Laterality: Left;  . COLONOSCOPY    . CORONARY ANGIOPLASTY WITH STENT PLACEMENT    . ROTATOR CUFF REPAIR Right      Allergies  Allergen Reactions  . Codeine Shortness Of Breath and Nausea And Vomiting  . Tramadol Nausea And Vomiting      Family History  Problem Relation Age of Onset  . Diabetes Mother   . Diabetes Father   . Heart failure Sister   . Diabetes Brother   . Heart disease Brother     before age 66  . Diabetes Maternal Grandmother   . Diabetes Maternal Grandfather   . Diabetes Paternal Grandmother   . Diabetes Paternal Grandfather   . Diabetes Sister   . Diabetes Sister   . Diabetes Brother      Social  History Ms. Grinder reports that she has never smoked. She has never used smokeless tobacco. Ms. Mara reports that she does not drink alcohol.   Review of Systems CONSTITUTIONAL: No weight loss, fever, chills, weakness or fatigue.  HEENT: Eyes: No visual loss, blurred vision, double vision or yellow sclerae.No hearing loss, sneezing, congestion, runny nose or sore throat.  SKIN: No rash or itching.  CARDIOVASCULAR: per hpi RESPIRATORY: No shortness of breath, cough or sputum.  GASTROINTESTINAL: No anorexia, nausea, vomiting or diarrhea. No abdominal pain or blood.  GENITOURINARY: No burning on urination, no polyuria NEUROLOGICAL: No headache, dizziness, syncope, paralysis, ataxia, numbness or tingling in the extremities. No change in bowel or bladder control.  MUSCULOSKELETAL: No muscle, back pain, joint pain or stiffness.  LYMPHATICS: No enlarged nodes. No history of splenectomy.  PSYCHIATRIC: No history of depression or anxiety.  ENDOCRINOLOGIC: No reports of sweating, cold or heat intolerance. No polyuria or polydipsia.  Marland Kitchen   Physical Examination Vitals:   04/13/16 1556  BP: (!) 173/70  Pulse: 85   Vitals:   04/13/16 1556  Weight: 190 lb 9.6 oz (86.5 kg)  Height: 5\' 6"  (1.676 m)    Gen: resting comfortably, no acute distress HEENT: no scleral icterus, pupils equal round and reactive, no palptable cervical adenopathy,  CV: RRR, on m/r/g, no jvd Resp: Clear to auscultation bilaterally GI: abdomen is soft, non-tender, non-distended, normal bowel sounds, no hepatosplenomegaly MSK: extremities are warm, no edema.  Skin: warm, no rash Neuro:  no focal deficits Psych: appropriate affect   Diagnostic Studies 02/2004 cath HEMODYNAMIC DATA: 1. Central aortic pressure 128/75, mean 96. 2. Left ventricle 135/8. 3. No gradient pullback across aortic valve.  ANGIOGRAPHIC DATA: 1. Ventriculography was done in the RAO projection. Overall systolic function was  vigorous. No segmental abnormalities or contraction were identified. 2. The left main coronary artery was free of critical disease and was very large caliber, being at least about 6 mm. 3. The left anterior descending artery courses to the apex. The LAD has two fairly small diagonal branches a major septal perforator. In the mid vessel the vessel appears to take an intramyocardial course and a distal vessel wraps the apex. At the very proximal portion of the left anterior descending artery there is an eccentric plaque. This probably ranges in the range of about 40-50%. It does not appear to be flow limiting and we took multiple views of this in multiple angiographic projections. My estimate would be that it would not likely  be flow-limiting. 4. There is a fairly large ramus intermedius vessel. This has about 40% ostial narrowing and then the mid vessel appears to be acentric with about 50% narrowing. 5. The circumflex is a dominant vessel. There is 50 to at most 60% narrowing in the mid vessel. This is very eccentric and smooth with a residual lumen of greater than 2 mm likely, again, not representing flow- limiting disease. 6. The right coronary artery is a nondominant vessel and appears to be nonobstructive.  CONCLUSION: 1. Well-preserved left ventricular function. 2. Multiple lesions of the coronary arteries with none appearing to be high grade or flow-limiting in nature.  RECOMMENDATIONS: Without question the patient has evidence of coronary atherosclerosis. The vessels are very large in caliber. The mid LAD would be estimated at at least about 4 mm in size. Importantly, there are multiple areas of plaquing but none of these appear to be high grade. Based on this, aggressive risk factor reduction would be recommended. Fortunately, the patient does not smoke. Optimization of her  hemoglobin A1C and control of lipids would be appropriate therapy at this time. I have discussed this case with Dr. Dannielle Burn and we will review again in further detail.   [facility] cath 04/2014 CORONARY ANGIOGRAPHY: Culprit vessel intitial: 95% stenosis in the proximal aspect of the lesion 75% stenosis in the mid to distal aspect of the lesion. Lesion length 15 mm. Disease segment 18-20 mm. Type BII lesion. Initial flow was TIMI-3 flow. Culprit vessel post intervention: No residual stenosis. Good stent apposition. TIMI 3 flow.    02/2016 echo Study Conclusions  - Left ventricle: The cavity size was normal. Wall thickness was   increased in a pattern of mild LVH. Systolic function was normal.   The estimated ejection fraction was 55%. There is akinesis of the   basalinferior myocardium. Features are consistent with a   pseudonormal left ventricular filling pattern, with concomitant   abnormal relaxation and increased filling pressure (grade 2   diastolic dysfunction). - Aortic valve: Mildly calcified annulus. Trileaflet; mildly   calcified leaflets. There was trivial regurgitation. - Mitral valve: Calcified annulus. There was mild regurgitation. - Left atrium: The atrium was moderately dilated. - Right atrium: Central venous pressure (est): 8 mm Hg. - Tricuspid valve: There was mild regurgitation. - Pulmonary arteries: Systolic pressure was severely increased. PA   peak pressure: 62 mm Hg (S). - Pericardium, extracardiac: There was no pericardial effusion.  Impressions:  - Mild LVH with LVEF 55%. Basal inferior akinesis. Grade 2   diastolic dysfunction. Moderate left atrial enlargement.   Calcified mitral annulus with mild mitral regurgitation.   Sclerotic aortic valve with trivial aortic regurgitation. Mild   tricuspid regurgitation with evidence of severe pulmonary   hypertension, PASP 62 mmHg.  Assessment and Plan   1. CAD  - no current symptoms. Continue current  meds  2. COPD - continue inhalers  3. CKD IV - followed by neprhology  4. Hyperlipidemia - request labs from pcp. She will continue statin.   5. HTN - elevated in clinic - increase norvasc to 10mg  daily.    Arnoldo Lenis, M.D.

## 2016-04-19 ENCOUNTER — Encounter: Payer: Self-pay | Admitting: Vascular Surgery

## 2016-04-26 NOTE — Progress Notes (Signed)
    Postoperative Access Visit   History of Present Illness  Donna Ochoa is a 83 y.o. year old female who presents for postoperative follow-up for: L 2nd BVT (Date: 03/30/16).  The patient's wounds are healed.  The patient notes no steal symptoms.  The patient is able to complete their activities of daily living.  The patient's current symptoms are: none.  For VQI Use Only  PRE-ADM LIVING: Home  AMB STATUS: Ambulatory  Physical Examination Vitals:   04/30/16 1344  BP: (!) 156/65  Pulse: 69  Resp: 20  Temp: 97.2 F (36.2 C)    LUE: Incision is healed, skin feels cool in both hands, hand grip is 5/5, sensation in digits is intact, palpable thrill, bruit can be easily auscultated   Medical Decision Making  Donna Ochoa is a 83 y.o. year old female who presents s/p L 2nd BVT.   The patient's access is ready for use.  Thank you for allowing [place] to participate in this patient's care.  Adele Barthel, MD, FACS [facility] Office: [phone] Pager: [phone]

## 2016-04-30 ENCOUNTER — Ambulatory Visit (INDEPENDENT_AMBULATORY_CARE_PROVIDER_SITE_OTHER): Payer: Self-pay | Admitting: Vascular Surgery

## 2016-04-30 ENCOUNTER — Encounter: Payer: Self-pay | Admitting: Vascular Surgery

## 2016-04-30 VITALS — BP 156/65 | HR 69 | Temp 97.2°F | Resp 20 | Ht 66.0 in | Wt 194.0 lb

## 2016-04-30 DIAGNOSIS — N184 Chronic kidney disease, stage 4 (severe): Secondary | ICD-10-CM

## 2016-05-07 ENCOUNTER — Encounter (HOSPITAL_COMMUNITY): Payer: Self-pay | Admitting: Vascular Surgery

## 2016-05-07 NOTE — Addendum Note (Signed)
Addendum  created 05/07/16 1040 by Belinda Block, MD   Anesthesia Event edited, Anesthesia Staff edited

## 2016-06-01 ENCOUNTER — Other Ambulatory Visit: Payer: Self-pay | Admitting: Cardiology

## 2016-07-27 ENCOUNTER — Encounter (HOSPITAL_COMMUNITY)
Admission: RE | Admit: 2016-07-27 | Discharge: 2016-07-27 | Disposition: A | Discharge status: Home / Self Care | Payer: Medicare Other | Source: Ambulatory Visit | Attending: Nephrology | Admitting: Nephrology

## 2016-07-27 DIAGNOSIS — N184 Chronic kidney disease, stage 4 (severe): Secondary | ICD-10-CM | POA: Diagnosis present

## 2016-07-27 DIAGNOSIS — D631 Anemia in chronic kidney disease: Secondary | ICD-10-CM | POA: Insufficient documentation

## 2016-07-27 LAB — RENAL FUNCTION PANEL
ALBUMIN: 3.2 g/dL — AB (ref 3.5–5.0)
ANION GAP: 13 (ref 5–15)
BUN: 83 mg/dL — AB (ref 6–20)
CALCIUM: 9.6 mg/dL (ref 8.9–10.3)
CO2: 28 mmol/L (ref 22–32)
CREATININE: 5.01 mg/dL — AB (ref 0.44–1.00)
Chloride: 100 mmol/L — ABNORMAL LOW (ref 101–111)
GFR calc Af Amer: 9 mL/min — ABNORMAL LOW (ref >60)
GFR calc non Af Amer: 7 mL/min — ABNORMAL LOW (ref >60)
GLUCOSE: 102 mg/dL — AB (ref 65–99)
PHOSPHORUS: 5.2 mg/dL — AB (ref 2.5–4.6)
Potassium: 4.7 mmol/L (ref 3.5–5.1)
Sodium: 141 mmol/L (ref 135–145)

## 2016-07-27 LAB — IRON AND TIBC
IRON: 78 ug/dL (ref 28–170)
Saturation Ratios: 22 % (ref 10.4–31.8)
TIBC: 349 ug/dL (ref 250–450)
UIBC: 271 ug/dL

## 2016-07-27 LAB — FERRITIN: FERRITIN: 89 ng/mL (ref 11–307)

## 2016-07-27 LAB — POCT HEMOGLOBIN-HEMACUE: Hemoglobin: 9.6 g/dL — ABNORMAL LOW (ref 12.0–15.0)

## 2016-07-27 MED ORDER — SODIUM CHLORIDE 0.9 % IV SOLN
INTRAVENOUS | Status: DC
  Administered 2016-07-27: 250 mL via INTRAVENOUS

## 2016-07-27 MED ORDER — SODIUM CHLORIDE 0.9 % IV SOLN
510.0000 mg | Freq: Once | INTRAVENOUS | Status: AC
  Administered 2016-07-27: 510 mg via INTRAVENOUS
  Filled 2016-07-27: qty 17

## 2016-07-27 MED ORDER — EPOETIN ALFA 10000 UNIT/ML IJ SOLN
INTRAMUSCULAR | Status: AC
  Filled 2016-07-27: qty 1

## 2016-07-27 MED ORDER — EPOETIN ALFA 10000 UNIT/ML IJ SOLN
10000.0000 [IU] | Freq: Once | INTRAMUSCULAR | Status: AC
  Administered 2016-07-27: 10000 [IU] via SUBCUTANEOUS

## 2016-07-27 NOTE — Discharge Instructions (Signed)

## 2016-07-28 LAB — PTH, INTACT AND CALCIUM
Calcium, Total (PTH): 9.4 mg/dL (ref 8.7–10.3)
PTH: 228 pg/mL — ABNORMAL HIGH (ref 15–65)

## 2016-07-29 LAB — VITAMIN D 25 HYDROXY (VIT D DEFICIENCY, FRACTURES): Vit D, 25-Hydroxy: 37.3 ng/mL (ref 30.0–100.0)

## 2016-08-02 NOTE — Progress Notes (Signed)
Results for Donna Ochoa, Donna Ochoa (MRN [id number]) as of 08/02/2016 11:28  Ref. Range 07/27/2016 08:04 07/27/2016 08:15  Sodium Latest Ref Range: 135 - 145 mmol/L 141   Potassium Latest Ref Range: 3.5 - 5.1 mmol/L 4.7   Chloride Latest Ref Range: 101 - 111 mmol/L 100 (L)   CO2 Latest Ref Range: 22 - 32 mmol/L 28   Glucose Latest Ref Range: 65 - 99 mg/dL 102 (H)   BUN Latest Ref Range: 6 - 20 mg/dL 83 (H)   Creatinine Latest Ref Range: 0.44 - 1.00 mg/dL 5.01 (H)   Calcium Latest Ref Range: 8.9 - 10.3 mg/dL 9.6   Anion gap Latest Ref Range: 5 - 15  13   Phosphorus Latest Ref Range: 2.5 - 4.6 mg/dL 5.2 (H)   Albumin Latest Ref Range: 3.5 - 5.0 g/dL 3.2 (L)   GFR, Est African American Latest Ref Range: >60 mL/min 9 (L)   GFR, Est Non African American Latest Ref Range: >60 mL/min 7 (L)   Iron Latest Ref Range: 28 - 170 ug/dL 78   UIBC Latest Units: ug/dL 271   TIBC Latest Ref Range: 250 - 450 ug/dL 349   Saturation Ratios Latest Ref Range: 10.4 - 31.8 % 22   Ferritin Latest Ref Range: 11 - 307 ng/mL 89   Vitamin D, 25-Hydroxy Latest Ref Range: 30.0 - 100.0 ng/mL 37.3   Hemoglobin Latest Ref Range: 12.0 - 15.0 g/dL  9.6 (L)  PTH, Intact Latest Ref Range: 15 - 65 pg/mL 228 (H)   PTH Interp Unknown Comment   Calcium, Total (PTH) Latest Ref Range: 8.7 - 10.3 mg/dL 9.4

## 2016-08-03 ENCOUNTER — Encounter (HOSPITAL_COMMUNITY)
Admission: RE | Admit: 2016-08-03 | Discharge: 2016-08-03 | Disposition: A | Discharge status: Home / Self Care | Payer: Medicare Other | Source: Ambulatory Visit | Attending: Nephrology | Admitting: Nephrology

## 2016-08-03 DIAGNOSIS — N184 Chronic kidney disease, stage 4 (severe): Secondary | ICD-10-CM | POA: Diagnosis not present

## 2016-08-03 MED ORDER — FERUMOXYTOL INJECTION 510 MG/17 ML
510.0000 mg | Freq: Once | INTRAVENOUS | Status: AC
Start: 2016-08-03 — End: 2016-08-03
  Administered 2016-08-03: 510 mg via INTRAVENOUS
  Filled 2016-08-03: qty 17

## 2016-08-03 MED ORDER — SODIUM CHLORIDE 0.9 % IV SOLN
INTRAVENOUS | Status: DC
  Administered 2016-08-03: 250 mL via INTRAVENOUS

## 2016-08-10 ENCOUNTER — Encounter (HOSPITAL_COMMUNITY)
Admission: RE | Admit: 2016-08-10 | Discharge: 2016-08-10 | Disposition: A | Discharge status: Home / Self Care | Payer: Medicare Other | Source: Ambulatory Visit | Attending: Nephrology | Admitting: Nephrology

## 2016-08-10 DIAGNOSIS — N184 Chronic kidney disease, stage 4 (severe): Secondary | ICD-10-CM | POA: Diagnosis not present

## 2016-08-10 LAB — POCT HEMOGLOBIN-HEMACUE: Hemoglobin: 10.3 g/dL — ABNORMAL LOW (ref 12.0–15.0)

## 2016-08-10 MED ORDER — EPOETIN ALFA 10000 UNIT/ML IJ SOLN
10000.0000 [IU] | INTRAMUSCULAR | Status: DC
  Administered 2016-08-10: 10000 [IU] via SUBCUTANEOUS

## 2016-08-10 MED ORDER — EPOETIN ALFA 10000 UNIT/ML IJ SOLN
INTRAMUSCULAR | Status: AC
  Filled 2016-08-10: qty 1

## 2016-08-24 ENCOUNTER — Encounter (HOSPITAL_COMMUNITY): Payer: Self-pay

## 2016-08-24 ENCOUNTER — Encounter (HOSPITAL_COMMUNITY)
Admission: RE | Admit: 2016-08-24 | Discharge: 2016-08-24 | Disposition: A | Discharge status: Home / Self Care | Payer: Medicare Other | Source: Ambulatory Visit | Attending: Nephrology | Admitting: Nephrology

## 2016-08-24 DIAGNOSIS — N184 Chronic kidney disease, stage 4 (severe): Secondary | ICD-10-CM | POA: Diagnosis present

## 2016-08-24 DIAGNOSIS — D631 Anemia in chronic kidney disease: Secondary | ICD-10-CM | POA: Diagnosis present

## 2016-08-24 LAB — RENAL FUNCTION PANEL
Albumin: 3.4 g/dL — ABNORMAL LOW (ref 3.5–5.0)
Anion gap: 12 (ref 5–15)
BUN: 79 mg/dL — AB (ref 6–20)
CHLORIDE: 101 mmol/L (ref 101–111)
CO2: 29 mmol/L (ref 22–32)
CREATININE: 4.83 mg/dL — AB (ref 0.44–1.00)
Calcium: 9.7 mg/dL (ref 8.9–10.3)
GFR calc Af Amer: 9 mL/min — ABNORMAL LOW (ref >60)
GFR, EST NON AFRICAN AMERICAN: 8 mL/min — AB (ref >60)
Glucose, Bld: 141 mg/dL — ABNORMAL HIGH (ref 65–99)
Phosphorus: 5.4 mg/dL — ABNORMAL HIGH (ref 2.5–4.6)
Potassium: 4.1 mmol/L (ref 3.5–5.1)
SODIUM: 142 mmol/L (ref 135–145)

## 2016-08-24 LAB — IRON AND TIBC
Iron: 101 ug/dL (ref 28–170)
SATURATION RATIOS: 33 % — AB (ref 10.4–31.8)
TIBC: 304 ug/dL (ref 250–450)
UIBC: 203 ug/dL

## 2016-08-24 LAB — POCT HEMOGLOBIN-HEMACUE: Hemoglobin: 11.2 g/dL — ABNORMAL LOW (ref 12.0–15.0)

## 2016-08-24 LAB — FERRITIN: FERRITIN: 589 ng/mL — AB (ref 11–307)

## 2016-08-24 LAB — MAGNESIUM: Magnesium: 2.1 mg/dL (ref 1.7–2.4)

## 2016-08-24 MED ORDER — EPOETIN ALFA 10000 UNIT/ML IJ SOLN
10000.0000 [IU] | Freq: Once | INTRAMUSCULAR | Status: AC
  Administered 2016-08-24: 10000 [IU] via SUBCUTANEOUS

## 2016-08-24 MED ORDER — EPOETIN ALFA 10000 UNIT/ML IJ SOLN
INTRAMUSCULAR | Status: AC
  Filled 2016-08-24: qty 1

## 2016-08-25 LAB — PTH, INTACT AND CALCIUM
CALCIUM TOTAL (PTH): 9.9 mg/dL (ref 8.7–10.3)
PTH: 189 pg/mL — AB (ref 15–65)

## 2016-08-25 LAB — VITAMIN D 25 HYDROXY (VIT D DEFICIENCY, FRACTURES): VIT D 25 HYDROXY: 42.8 ng/mL (ref 30.0–100.0)

## 2016-08-26 ENCOUNTER — Ambulatory Visit (INDEPENDENT_AMBULATORY_CARE_PROVIDER_SITE_OTHER): Payer: Medicare Other | Admitting: Otolaryngology

## 2016-09-07 ENCOUNTER — Encounter (HOSPITAL_COMMUNITY)
Admission: RE | Admit: 2016-09-07 | Discharge: 2016-09-07 | Disposition: A | Discharge status: Home / Self Care | Payer: Medicare Other | Source: Ambulatory Visit | Attending: Nephrology | Admitting: Nephrology

## 2016-09-07 DIAGNOSIS — D631 Anemia in chronic kidney disease: Secondary | ICD-10-CM | POA: Diagnosis not present

## 2016-09-07 LAB — POCT HEMOGLOBIN-HEMACUE: Hemoglobin: 11 g/dL — ABNORMAL LOW (ref 12.0–15.0)

## 2016-09-07 MED ORDER — EPOETIN ALFA 10000 UNIT/ML IJ SOLN
10000.0000 [IU] | INTRAMUSCULAR | Status: DC
  Administered 2016-09-07: 10000 [IU] via SUBCUTANEOUS

## 2016-09-07 MED ORDER — EPOETIN ALFA 10000 UNIT/ML IJ SOLN
INTRAMUSCULAR | Status: AC
  Filled 2016-09-07: qty 1

## 2016-09-21 ENCOUNTER — Encounter (HOSPITAL_COMMUNITY)
Admission: RE | Admit: 2016-09-21 | Discharge: 2016-09-21 | Disposition: A | Discharge status: Home / Self Care | Payer: Medicare Other | Source: Ambulatory Visit | Attending: Nephrology | Admitting: Nephrology

## 2016-09-21 ENCOUNTER — Encounter (HOSPITAL_COMMUNITY): Payer: Medicare Other

## 2016-09-21 ENCOUNTER — Other Ambulatory Visit: Payer: Self-pay | Admitting: Cardiology

## 2016-11-17 ENCOUNTER — Other Ambulatory Visit: Payer: Self-pay | Admitting: Cardiology

## 2016-11-29 ENCOUNTER — Other Ambulatory Visit: Payer: Self-pay | Admitting: Cardiology

## 2016-12-30 ENCOUNTER — Encounter (HOSPITAL_COMMUNITY)
Admission: RE | Admit: 2016-12-30 | Discharge: 2016-12-30 | Disposition: A | Discharge status: Home / Self Care | Payer: Medicare Other | Source: Ambulatory Visit | Attending: Nephrology | Admitting: Nephrology

## 2016-12-30 DIAGNOSIS — N184 Chronic kidney disease, stage 4 (severe): Secondary | ICD-10-CM | POA: Insufficient documentation

## 2016-12-30 DIAGNOSIS — D631 Anemia in chronic kidney disease: Secondary | ICD-10-CM | POA: Insufficient documentation

## 2016-12-30 LAB — RENAL FUNCTION PANEL
Albumin: 3.3 g/dL — ABNORMAL LOW (ref 3.5–5.0)
Anion gap: 13 (ref 5–15)
BUN: 73 mg/dL — AB (ref 6–20)
CHLORIDE: 104 mmol/L (ref 101–111)
CO2: 24 mmol/L (ref 22–32)
CREATININE: 4.23 mg/dL — AB (ref 0.44–1.00)
Calcium: 9.7 mg/dL (ref 8.9–10.3)
GFR calc Af Amer: 10 mL/min — ABNORMAL LOW (ref >60)
GFR calc non Af Amer: 9 mL/min — ABNORMAL LOW (ref >60)
GLUCOSE: 228 mg/dL — AB (ref 65–99)
Phosphorus: 3.3 mg/dL (ref 2.5–4.6)
Potassium: 5.1 mmol/L (ref 3.5–5.1)
Sodium: 141 mmol/L (ref 135–145)

## 2016-12-30 LAB — IRON AND TIBC
Iron: 81 ug/dL (ref 28–170)
SATURATION RATIOS: 30 % (ref 10.4–31.8)
TIBC: 274 ug/dL (ref 250–450)
UIBC: 193 ug/dL

## 2016-12-30 LAB — POCT HEMOGLOBIN-HEMACUE: Hemoglobin: 8.9 g/dL — ABNORMAL LOW (ref 12.0–15.0)

## 2016-12-30 LAB — FERRITIN: Ferritin: 415 ng/mL — ABNORMAL HIGH (ref 11–307)

## 2016-12-30 LAB — MAGNESIUM: Magnesium: 2.1 mg/dL (ref 1.7–2.4)

## 2016-12-30 MED ORDER — EPOETIN ALFA 10000 UNIT/ML IJ SOLN
10000.0000 [IU] | INTRAMUSCULAR | Status: DC
Start: 2016-12-30 — End: 2016-12-31
  Administered 2016-12-30: 10000 [IU] via SUBCUTANEOUS

## 2016-12-30 MED ORDER — EPOETIN ALFA 10000 UNIT/ML IJ SOLN
INTRAMUSCULAR | Status: AC
  Filled 2016-12-30: qty 1

## 2016-12-31 LAB — PTH, INTACT AND CALCIUM
CALCIUM TOTAL (PTH): 9.8 mg/dL (ref 8.7–10.3)
PTH: 151 pg/mL — ABNORMAL HIGH (ref 15–65)

## 2016-12-31 LAB — VITAMIN D 25 HYDROXY (VIT D DEFICIENCY, FRACTURES): Vit D, 25-Hydroxy: 43.2 ng/mL (ref 30.0–100.0)

## 2017-01-13 ENCOUNTER — Encounter (HOSPITAL_COMMUNITY)
Admission: RE | Admit: 2017-01-13 | Discharge: 2017-01-13 | Disposition: A | Discharge status: Home / Self Care | Payer: Medicare Other | Source: Ambulatory Visit | Attending: Nephrology | Admitting: Nephrology

## 2017-01-13 DIAGNOSIS — D631 Anemia in chronic kidney disease: Secondary | ICD-10-CM | POA: Insufficient documentation

## 2017-01-13 DIAGNOSIS — N184 Chronic kidney disease, stage 4 (severe): Secondary | ICD-10-CM | POA: Diagnosis not present

## 2017-01-13 LAB — POCT HEMOGLOBIN-HEMACUE: HEMOGLOBIN: 9.9 g/dL — AB (ref 12.0–15.0)

## 2017-01-13 MED ORDER — EPOETIN ALFA 10000 UNIT/ML IJ SOLN
INTRAMUSCULAR | Status: AC
  Filled 2017-01-13: qty 1

## 2017-01-13 MED ORDER — EPOETIN ALFA 10000 UNIT/ML IJ SOLN
10000.0000 [IU] | Freq: Once | INTRAMUSCULAR | Status: AC
  Administered 2017-01-13: 10000 [IU] via SUBCUTANEOUS

## 2017-01-27 ENCOUNTER — Encounter (HOSPITAL_COMMUNITY)
Admission: RE | Admit: 2017-01-27 | Discharge: 2017-01-27 | Disposition: A | Discharge status: Home / Self Care | Payer: Medicare Other | Source: Ambulatory Visit | Attending: Nephrology | Admitting: Nephrology

## 2017-01-27 DIAGNOSIS — N184 Chronic kidney disease, stage 4 (severe): Secondary | ICD-10-CM | POA: Diagnosis not present

## 2017-01-27 LAB — RENAL FUNCTION PANEL
Albumin: 3.7 g/dL (ref 3.5–5.0)
Anion gap: 14 (ref 5–15)
BUN: 101 mg/dL — AB (ref 6–20)
CHLORIDE: 105 mmol/L (ref 101–111)
CO2: 21 mmol/L — AB (ref 22–32)
CREATININE: 5.1 mg/dL — AB (ref 0.44–1.00)
Calcium: 9.9 mg/dL (ref 8.9–10.3)
GFR calc Af Amer: 8 mL/min — ABNORMAL LOW (ref >60)
GFR calc non Af Amer: 7 mL/min — ABNORMAL LOW (ref >60)
Glucose, Bld: 216 mg/dL — ABNORMAL HIGH (ref 65–99)
Phosphorus: 4.2 mg/dL (ref 2.5–4.6)
Potassium: 4.7 mmol/L (ref 3.5–5.1)
Sodium: 140 mmol/L (ref 135–145)

## 2017-01-27 LAB — IRON AND TIBC
IRON: 94 ug/dL (ref 28–170)
Saturation Ratios: 31 % (ref 10.4–31.8)
TIBC: 300 ug/dL (ref 250–450)
UIBC: 206 ug/dL

## 2017-01-27 LAB — FERRITIN: Ferritin: 344 ng/mL — ABNORMAL HIGH (ref 11–307)

## 2017-01-27 LAB — MAGNESIUM: MAGNESIUM: 2.4 mg/dL (ref 1.7–2.4)

## 2017-01-27 LAB — POCT HEMOGLOBIN-HEMACUE: Hemoglobin: 11.5 g/dL — ABNORMAL LOW (ref 12.0–15.0)

## 2017-01-27 MED ORDER — EPOETIN ALFA 10000 UNIT/ML IJ SOLN
10000.0000 [IU] | INTRAMUSCULAR | Status: DC
  Administered 2017-01-27: 10000 [IU] via SUBCUTANEOUS

## 2017-01-27 MED ORDER — EPOETIN ALFA 10000 UNIT/ML IJ SOLN
INTRAMUSCULAR | Status: AC
  Filled 2017-01-27: qty 1

## 2017-02-04 ENCOUNTER — Other Ambulatory Visit: Payer: Self-pay | Admitting: Cardiology

## 2017-02-09 ENCOUNTER — Other Ambulatory Visit: Payer: Self-pay | Admitting: Cardiology

## 2017-02-10 ENCOUNTER — Encounter (HOSPITAL_COMMUNITY)
Admission: RE | Admit: 2017-02-10 | Discharge: 2017-02-10 | Disposition: A | Discharge status: Home / Self Care | Payer: Medicare Other | Source: Ambulatory Visit | Attending: Nephrology | Admitting: Nephrology

## 2017-02-10 ENCOUNTER — Encounter (HOSPITAL_COMMUNITY): Payer: Self-pay

## 2017-02-10 DIAGNOSIS — N184 Chronic kidney disease, stage 4 (severe): Secondary | ICD-10-CM | POA: Diagnosis not present

## 2017-02-10 LAB — POCT HEMOGLOBIN-HEMACUE: Hemoglobin: 13 g/dL (ref 12.0–15.0)

## 2017-02-10 MED ORDER — EPOETIN ALFA 10000 UNIT/ML IJ SOLN: 10000.0000 [IU] | INTRAMUSCULAR | Status: DC

## 2017-02-10 NOTE — Progress Notes (Signed)
Patient presents for procrit injection, however does not meet parameters for injection.  Hgb 13 and BP 191/58.  Had taken blood pressure medication at 745.  Advised to contact PCP regarding pressure.  States she will call him as soon as she gets in the car to make an appointment.  Copy of BP sent with patient.  Appointment made for 2 weeks.

## 2017-02-24 ENCOUNTER — Encounter (HOSPITAL_COMMUNITY)
Admission: RE | Admit: 2017-02-24 | Discharge: 2017-02-24 | Disposition: A | Discharge status: Home / Self Care | Payer: Medicare Other | Source: Ambulatory Visit | Attending: Nephrology | Admitting: Nephrology

## 2017-02-24 DIAGNOSIS — N184 Chronic kidney disease, stage 4 (severe): Secondary | ICD-10-CM | POA: Insufficient documentation

## 2017-02-24 DIAGNOSIS — D631 Anemia in chronic kidney disease: Secondary | ICD-10-CM | POA: Diagnosis present

## 2017-02-24 LAB — RENAL FUNCTION PANEL
ALBUMIN: 3.6 g/dL (ref 3.5–5.0)
Anion gap: 16 — ABNORMAL HIGH (ref 5–15)
BUN: 103 mg/dL — AB (ref 6–20)
CO2: 24 mmol/L (ref 22–32)
Calcium: 9.6 mg/dL (ref 8.9–10.3)
Chloride: 101 mmol/L (ref 101–111)
Creatinine, Ser: 4.88 mg/dL — ABNORMAL HIGH (ref 0.44–1.00)
GFR calc non Af Amer: 8 mL/min — ABNORMAL LOW (ref >60)
GFR, EST AFRICAN AMERICAN: 9 mL/min — AB (ref >60)
Glucose, Bld: 242 mg/dL — ABNORMAL HIGH (ref 65–99)
PHOSPHORUS: 5 mg/dL — AB (ref 2.5–4.6)
POTASSIUM: 4.4 mmol/L (ref 3.5–5.1)
Sodium: 141 mmol/L (ref 135–145)

## 2017-02-24 LAB — POCT HEMOGLOBIN-HEMACUE: HEMOGLOBIN: 10.1 g/dL — AB (ref 12.0–15.0)

## 2017-02-24 LAB — IRON AND TIBC
IRON: 98 ug/dL (ref 28–170)
Saturation Ratios: 34 % — ABNORMAL HIGH (ref 10.4–31.8)
TIBC: 290 ug/dL (ref 250–450)
UIBC: 192 ug/dL

## 2017-02-24 LAB — MAGNESIUM: Magnesium: 2 mg/dL (ref 1.7–2.4)

## 2017-02-24 LAB — FERRITIN: Ferritin: 365 ng/mL — ABNORMAL HIGH (ref 11–307)

## 2017-02-24 MED ORDER — EPOETIN ALFA 10000 UNIT/ML IJ SOLN
INTRAMUSCULAR | Status: AC
Start: 2017-02-24 — End: 2017-02-24
  Filled 2017-02-24: qty 1

## 2017-02-24 MED ORDER — EPOETIN ALFA 10000 UNIT/ML IJ SOLN
10000.0000 [IU] | INTRAMUSCULAR | Status: DC
  Administered 2017-02-24: 10000 [IU] via SUBCUTANEOUS

## 2017-02-25 NOTE — Progress Notes (Signed)
Results for Donna Ochoa, Donna Ochoa (MRN [id number]) as of 02/25/2017 12:12  Ref. Range 02/24/2017 08:52 02/24/2017 09:05  Sodium Latest Ref Range: 135 - 145 mmol/L 141   Potassium Latest Ref Range: 3.5 - 5.1 mmol/L 4.4   Chloride Latest Ref Range: 101 - 111 mmol/L 101   CO2 Latest Ref Range: 22 - 32 mmol/L 24   Glucose Latest Ref Range: 65 - 99 mg/dL 242 (H)   BUN Latest Ref Range: 6 - 20 mg/dL 103 (H)   Creatinine Latest Ref Range: 0.44 - 1.00 mg/dL 4.88 (H)   Calcium Latest Ref Range: 8.9 - 10.3 mg/dL 9.6   Anion gap Latest Ref Range: 5 - 15  16 (H)   Phosphorus Latest Ref Range: 2.5 - 4.6 mg/dL 5.0 (H)   Magnesium Latest Ref Range: 1.7 - 2.4 mg/dL 2.0   Albumin Latest Ref Range: 3.5 - 5.0 g/dL 3.6   GFR, Est Non African American Latest Ref Range: >60 mL/min 8 (L)   GFR, Est African American Latest Ref Range: >60 mL/min 9 (L)   Iron Latest Ref Range: 28 - 170 ug/dL 98   UIBC Latest Units: ug/dL 192   TIBC Latest Ref Range: 250 - 450 ug/dL 290   Saturation Ratios Latest Ref Range: 10.4 - 31.8 % 34 (H)   Ferritin Latest Ref Range: 11 - 307 ng/mL 365 (H)   Hemoglobin Latest Ref Range: 12.0 - 15.0 g/dL  10.1 (L)

## 2017-03-10 ENCOUNTER — Encounter (HOSPITAL_COMMUNITY)
Admission: RE | Admit: 2017-03-10 | Discharge: 2017-03-10 | Disposition: A | Discharge status: Home / Self Care | Payer: Medicare Other | Source: Ambulatory Visit | Attending: Nephrology | Admitting: Nephrology

## 2017-03-10 DIAGNOSIS — N184 Chronic kidney disease, stage 4 (severe): Secondary | ICD-10-CM | POA: Diagnosis not present

## 2017-03-10 LAB — POCT HEMOGLOBIN-HEMACUE: HEMOGLOBIN: 9.9 g/dL — AB (ref 12.0–15.0)

## 2017-03-10 MED ORDER — EPOETIN ALFA 10000 UNIT/ML IJ SOLN
10000.0000 [IU] | INTRAMUSCULAR | Status: DC
  Administered 2017-03-10: 10000 [IU] via SUBCUTANEOUS

## 2017-03-10 MED ORDER — EPOETIN ALFA 10000 UNIT/ML IJ SOLN
INTRAMUSCULAR | Status: AC
  Filled 2017-03-10: qty 1

## 2017-03-24 ENCOUNTER — Encounter (HOSPITAL_COMMUNITY)
Admission: RE | Admit: 2017-03-24 | Discharge: 2017-03-24 | Disposition: A | Discharge status: Home / Self Care | Payer: Medicare Other | Source: Ambulatory Visit | Attending: Nephrology | Admitting: Nephrology

## 2017-03-24 ENCOUNTER — Encounter (HOSPITAL_COMMUNITY): Payer: Self-pay

## 2017-03-24 DIAGNOSIS — D631 Anemia in chronic kidney disease: Secondary | ICD-10-CM | POA: Diagnosis not present

## 2017-03-24 DIAGNOSIS — Z79899 Other long term (current) drug therapy: Secondary | ICD-10-CM | POA: Diagnosis not present

## 2017-03-24 DIAGNOSIS — Z5181 Encounter for therapeutic drug level monitoring: Secondary | ICD-10-CM | POA: Insufficient documentation

## 2017-03-24 DIAGNOSIS — N184 Chronic kidney disease, stage 4 (severe): Secondary | ICD-10-CM | POA: Diagnosis not present

## 2017-03-24 LAB — RENAL FUNCTION PANEL
Albumin: 3.3 g/dL — ABNORMAL LOW (ref 3.5–5.0)
Anion gap: 16 — ABNORMAL HIGH (ref 5–15)
BUN: 88 mg/dL — ABNORMAL HIGH (ref 6–20)
CALCIUM: 9.7 mg/dL (ref 8.9–10.3)
CO2: 24 mmol/L (ref 22–32)
Chloride: 98 mmol/L — ABNORMAL LOW (ref 101–111)
Creatinine, Ser: 5.6 mg/dL — ABNORMAL HIGH (ref 0.44–1.00)
GFR, EST AFRICAN AMERICAN: 7 mL/min — AB (ref >60)
GFR, EST NON AFRICAN AMERICAN: 6 mL/min — AB (ref >60)
Glucose, Bld: 265 mg/dL — ABNORMAL HIGH (ref 65–99)
Phosphorus: 3.5 mg/dL (ref 2.5–4.6)
Potassium: 4.3 mmol/L (ref 3.5–5.1)
SODIUM: 138 mmol/L (ref 135–145)

## 2017-03-24 LAB — MAGNESIUM: Magnesium: 2.5 mg/dL — ABNORMAL HIGH (ref 1.7–2.4)

## 2017-03-24 LAB — IRON AND TIBC
IRON: 37 ug/dL (ref 28–170)
Saturation Ratios: 13 % (ref 10.4–31.8)
TIBC: 276 ug/dL (ref 250–450)
UIBC: 239 ug/dL

## 2017-03-24 LAB — POCT HEMOGLOBIN-HEMACUE: Hemoglobin: 10.7 g/dL — ABNORMAL LOW (ref 12.0–15.0)

## 2017-03-24 LAB — FERRITIN: FERRITIN: 273 ng/mL (ref 11–307)

## 2017-03-24 MED ORDER — EPOETIN ALFA 10000 UNIT/ML IJ SOLN
INTRAMUSCULAR | Status: AC
  Filled 2017-03-24: qty 1

## 2017-03-24 MED ORDER — EPOETIN ALFA 10000 UNIT/ML IJ SOLN
10000.0000 [IU] | INTRAMUSCULAR | Status: DC
  Administered 2017-03-24: 10000 [IU] via SUBCUTANEOUS

## 2017-03-25 LAB — PTH, INTACT AND CALCIUM
CALCIUM TOTAL (PTH): 9.7 mg/dL (ref 8.7–10.3)
PTH: 185 pg/mL — ABNORMAL HIGH (ref 15–65)

## 2017-03-25 LAB — VITAMIN D 25 HYDROXY (VIT D DEFICIENCY, FRACTURES): Vit D, 25-Hydroxy: 44.7 ng/mL (ref 30.0–100.0)

## 2017-03-31 ENCOUNTER — Encounter (HOSPITAL_COMMUNITY): Payer: Self-pay

## 2017-03-31 ENCOUNTER — Encounter (HOSPITAL_COMMUNITY)
Admission: RE | Admit: 2017-03-31 | Discharge: 2017-03-31 | Disposition: A | Discharge status: Home / Self Care | Payer: Medicare Other | Source: Ambulatory Visit | Attending: Nephrology | Admitting: Nephrology

## 2017-03-31 DIAGNOSIS — N184 Chronic kidney disease, stage 4 (severe): Secondary | ICD-10-CM | POA: Diagnosis not present

## 2017-03-31 MED ORDER — SODIUM CHLORIDE 0.9 % IV SOLN
510.0000 mg | Freq: Once | INTRAVENOUS | Status: AC
  Administered 2017-03-31: 510 mg via INTRAVENOUS
  Filled 2017-03-31: qty 17

## 2017-03-31 MED ORDER — SODIUM CHLORIDE 0.9 % IV SOLN
INTRAVENOUS | Status: DC
  Administered 2017-03-31: 11:00:00 via INTRAVENOUS

## 2017-03-31 NOTE — Progress Notes (Signed)
Patient tolerated first infusion well. BP after infusion was 147/57 and she had no issues with dizziness. Patient discharged to home in stable condition.

## 2017-04-07 ENCOUNTER — Encounter (HOSPITAL_COMMUNITY): Payer: Self-pay

## 2017-04-07 ENCOUNTER — Encounter (HOSPITAL_COMMUNITY)
Admission: RE | Admit: 2017-04-07 | Discharge: 2017-04-07 | Disposition: A | Discharge status: Home / Self Care | Payer: Medicare Other | Source: Ambulatory Visit | Attending: Nephrology | Admitting: Nephrology

## 2017-04-07 DIAGNOSIS — N184 Chronic kidney disease, stage 4 (severe): Secondary | ICD-10-CM | POA: Diagnosis not present

## 2017-04-07 LAB — POCT HEMOGLOBIN-HEMACUE: Hemoglobin: 9.7 g/dL — ABNORMAL LOW (ref 12.0–15.0)

## 2017-04-07 MED ORDER — SODIUM CHLORIDE 0.9 % IV SOLN
INTRAVENOUS | Status: DC
  Administered 2017-04-07: 09:00:00 via INTRAVENOUS

## 2017-04-07 MED ORDER — EPOETIN ALFA 10000 UNIT/ML IJ SOLN
10000.0000 [IU] | Freq: Once | INTRAMUSCULAR | Status: AC
  Administered 2017-04-07: 10000 [IU] via SUBCUTANEOUS
  Filled 2017-04-07: qty 1

## 2017-04-07 MED ORDER — FERUMOXYTOL INJECTION 510 MG/17 ML
510.0000 mg | Freq: Once | INTRAVENOUS | Status: AC
  Administered 2017-04-07: 510 mg via INTRAVENOUS
  Filled 2017-04-07: qty 17

## 2017-04-21 ENCOUNTER — Encounter (HOSPITAL_COMMUNITY)
Admission: RE | Admit: 2017-04-21 | Discharge: 2017-04-21 | Disposition: A | Discharge status: Home / Self Care | Payer: Medicare Other | Source: Ambulatory Visit | Attending: Nephrology | Admitting: Nephrology

## 2017-04-21 ENCOUNTER — Encounter (HOSPITAL_COMMUNITY): Payer: Self-pay

## 2017-04-21 ENCOUNTER — Other Ambulatory Visit: Payer: Self-pay | Admitting: Cardiology

## 2017-04-21 DIAGNOSIS — N184 Chronic kidney disease, stage 4 (severe): Secondary | ICD-10-CM | POA: Diagnosis not present

## 2017-04-21 DIAGNOSIS — Z79899 Other long term (current) drug therapy: Secondary | ICD-10-CM | POA: Diagnosis not present

## 2017-04-21 DIAGNOSIS — Z5181 Encounter for therapeutic drug level monitoring: Secondary | ICD-10-CM | POA: Diagnosis not present

## 2017-04-21 DIAGNOSIS — D631 Anemia in chronic kidney disease: Secondary | ICD-10-CM | POA: Insufficient documentation

## 2017-04-21 LAB — FERRITIN: FERRITIN: 1021 ng/mL — AB (ref 11–307)

## 2017-04-21 LAB — RENAL FUNCTION PANEL
ALBUMIN: 3.4 g/dL — AB (ref 3.5–5.0)
Anion gap: 14 (ref 5–15)
BUN: 84 mg/dL — ABNORMAL HIGH (ref 6–20)
CALCIUM: 9.7 mg/dL (ref 8.9–10.3)
CO2: 29 mmol/L (ref 22–32)
CREATININE: 4.52 mg/dL — AB (ref 0.44–1.00)
Chloride: 98 mmol/L — ABNORMAL LOW (ref 101–111)
GFR, EST AFRICAN AMERICAN: 10 mL/min — AB (ref >60)
GFR, EST NON AFRICAN AMERICAN: 8 mL/min — AB (ref >60)
Glucose, Bld: 135 mg/dL — ABNORMAL HIGH (ref 65–99)
Phosphorus: 3.6 mg/dL (ref 2.5–4.6)
Potassium: 4 mmol/L (ref 3.5–5.1)
SODIUM: 141 mmol/L (ref 135–145)

## 2017-04-21 LAB — IRON AND TIBC
IRON: 94 ug/dL (ref 28–170)
Saturation Ratios: 35 % — ABNORMAL HIGH (ref 10.4–31.8)
TIBC: 266 ug/dL (ref 250–450)
UIBC: 172 ug/dL

## 2017-04-21 LAB — MAGNESIUM: MAGNESIUM: 2 mg/dL (ref 1.7–2.4)

## 2017-04-21 LAB — POCT HEMOGLOBIN-HEMACUE: Hemoglobin: 11.5 g/dL — ABNORMAL LOW (ref 12.0–15.0)

## 2017-04-21 MED ORDER — EPOETIN ALFA 10000 UNIT/ML IJ SOLN
10000.0000 [IU] | Freq: Once | INTRAMUSCULAR | Status: AC
  Administered 2017-04-21: 10000 [IU] via SUBCUTANEOUS
  Filled 2017-04-21: qty 1

## 2017-05-05 ENCOUNTER — Encounter (HOSPITAL_COMMUNITY): Payer: Self-pay

## 2017-05-05 ENCOUNTER — Encounter (HOSPITAL_COMMUNITY)
Admission: RE | Admit: 2017-05-05 | Discharge: 2017-05-05 | Disposition: A | Discharge status: Home / Self Care | Payer: Medicare Other | Source: Ambulatory Visit | Attending: Nephrology | Admitting: Nephrology

## 2017-05-05 ENCOUNTER — Encounter (HOSPITAL_COMMUNITY)
Admission: RE | Admit: 2017-05-05 | Discharge: 2017-05-05 | Disposition: A | Discharge status: Home / Self Care | Payer: Medicare Other | Source: Ambulatory Visit | Attending: Endocrinology | Admitting: Endocrinology

## 2017-05-05 DIAGNOSIS — N184 Chronic kidney disease, stage 4 (severe): Secondary | ICD-10-CM | POA: Diagnosis not present

## 2017-05-05 DIAGNOSIS — D631 Anemia in chronic kidney disease: Secondary | ICD-10-CM | POA: Insufficient documentation

## 2017-05-05 LAB — POCT HEMOGLOBIN-HEMACUE: HEMOGLOBIN: 10.8 g/dL — AB (ref 12.0–15.0)

## 2017-05-05 MED ORDER — EPOETIN ALFA 10000 UNIT/ML IJ SOLN
10000.0000 [IU] | Freq: Once | INTRAMUSCULAR | Status: AC
  Administered 2017-05-05: 10000 [IU] via SUBCUTANEOUS

## 2017-05-05 MED ORDER — EPOETIN ALFA 10000 UNIT/ML IJ SOLN
INTRAMUSCULAR | Status: AC
  Filled 2017-05-05: qty 1

## 2017-05-05 NOTE — Progress Notes (Signed)
Patient c/o feeling somewhat dizzy today.  BP normal and orthostatic BP wnl.  States it is improving somewhat.  Has Antivert at home for vertigo.  Advised her to take when she gets home and notify Dr Manuella Ghazi for further advice.

## 2017-05-19 ENCOUNTER — Encounter (HOSPITAL_COMMUNITY)
Admission: RE | Admit: 2017-05-19 | Discharge: 2017-05-19 | Disposition: A | Discharge status: Home / Self Care | Payer: Medicare Other | Source: Ambulatory Visit | Attending: Nephrology | Admitting: Nephrology

## 2017-05-19 ENCOUNTER — Encounter (HOSPITAL_COMMUNITY): Payer: Self-pay

## 2017-05-19 DIAGNOSIS — D631 Anemia in chronic kidney disease: Secondary | ICD-10-CM | POA: Diagnosis not present

## 2017-05-19 DIAGNOSIS — N184 Chronic kidney disease, stage 4 (severe): Secondary | ICD-10-CM | POA: Diagnosis present

## 2017-05-19 LAB — RENAL FUNCTION PANEL
Albumin: 3.3 g/dL — ABNORMAL LOW (ref 3.5–5.0)
Anion gap: 12 (ref 5–15)
BUN: 71 mg/dL — ABNORMAL HIGH (ref 6–20)
CHLORIDE: 97 mmol/L — AB (ref 101–111)
CO2: 29 mmol/L (ref 22–32)
CREATININE: 4.11 mg/dL — AB (ref 0.44–1.00)
Calcium: 9.4 mg/dL (ref 8.9–10.3)
GFR calc Af Amer: 11 mL/min — ABNORMAL LOW (ref >60)
GFR calc non Af Amer: 9 mL/min — ABNORMAL LOW (ref >60)
GLUCOSE: 223 mg/dL — AB (ref 65–99)
Phosphorus: 4.6 mg/dL (ref 2.5–4.6)
Potassium: 4.7 mmol/L (ref 3.5–5.1)
SODIUM: 138 mmol/L (ref 135–145)

## 2017-05-19 LAB — FERRITIN: Ferritin: 682 ng/mL — ABNORMAL HIGH (ref 11–307)

## 2017-05-19 LAB — IRON AND TIBC
Iron: 115 ug/dL (ref 28–170)
Saturation Ratios: 42 % — ABNORMAL HIGH (ref 10.4–31.8)
TIBC: 272 ug/dL (ref 250–450)
UIBC: 157 ug/dL

## 2017-05-19 LAB — POCT HEMOGLOBIN-HEMACUE: HEMOGLOBIN: 10.6 g/dL — AB (ref 12.0–15.0)

## 2017-05-19 MED ORDER — EPOETIN ALFA 10000 UNIT/ML IJ SOLN
10000.0000 [IU] | Freq: Once | INTRAMUSCULAR | Status: AC
  Administered 2017-05-19: 10000 [IU] via SUBCUTANEOUS

## 2017-06-02 ENCOUNTER — Encounter (HOSPITAL_COMMUNITY): Payer: Self-pay

## 2017-06-02 ENCOUNTER — Encounter (HOSPITAL_COMMUNITY)
Admission: RE | Admit: 2017-06-02 | Discharge: 2017-06-02 | Disposition: A | Discharge status: Home / Self Care | Payer: Medicare Other | Source: Ambulatory Visit | Attending: Nephrology | Admitting: Nephrology

## 2017-06-02 DIAGNOSIS — N184 Chronic kidney disease, stage 4 (severe): Secondary | ICD-10-CM | POA: Diagnosis not present

## 2017-06-02 LAB — POCT HEMOGLOBIN-HEMACUE: Hemoglobin: 11.2 g/dL — ABNORMAL LOW (ref 12.0–15.0)

## 2017-06-02 MED ORDER — EPOETIN ALFA 10000 UNIT/ML IJ SOLN
10000.0000 [IU] | Freq: Once | INTRAMUSCULAR | Status: AC
  Administered 2017-06-02: 10000 [IU] via SUBCUTANEOUS
  Filled 2017-06-02: qty 1

## 2017-06-14 ENCOUNTER — Ambulatory Visit: Payer: Medicare Other | Admitting: Cardiology

## 2017-06-14 NOTE — Progress Notes (Deleted)
Clinical Summary Donna Ochoa is a 84 y.o.female seen today for follow up of the following medical problems.   1. CAD - NSTEMI 04/2014 at [place], received DES to LCX. Echo at that time according to notes with normal LVEF.  - ASA allergy,notes indicate desensitization during 04/2014 admission at [facility]. Now taking ASA at home.   - no recent chest pain. No SOB or DOE.  - compliant with meds.  - upcoming labs with pcp  2. COPD - compliant with inhalers.   3. Anemia - followed by pcp Dr Woody Seller.   4. CKD IV - followed by nephorology   5. Chronic diastolic HF - echo 07/2534 LVEF 55%, grade II diastolic dysfunction, PASP 62, normal RV - occasional LE edema at times. Limiting salt intake.  - compliant with diuretics  6. HTN - compliant with meds    Past Medical History:  Diagnosis Date  . Anemia   . Arthritis   . Asthma   . Chronic kidney disease    [facility]  . Coronary artery disease 04/2014   1 stent  . Diabetes mellitus without complication ([place])    type 2  . Dyspnea    when she has too much fluid  . Head injury with loss of consciousness ([place]) 2014  . History of blood transfusion   . Hypertension   . Pneumonia    03/29/16- years ago     Allergies  Allergen Reactions  . Codeine Shortness Of Breath and Nausea And Vomiting  . Tramadol Nausea And Vomiting     Current Outpatient Medications  Medication Sig Dispense Refill  . amLODipine (NORVASC) 10 MG tablet TAKE 1 TABLET ONCE DAILY. 15 tablet 0  . aspirin EC 81 MG tablet Take 81 mg by mouth daily.    Marland Kitchen atorvastatin (LIPITOR) 80 MG tablet TAKE 1 TABLET BY MOUTH AT BEDTIME. 30 tablet 6  . calcitRIOL (ROCALTROL) 0.25 MCG capsule Take 0.25 mcg by mouth daily.    . ergocalciferol (VITAMIN D2) 50000 units capsule Take 50,000 Units by mouth once a week. Mondays    . fluticasone furoate-vilanterol (BREO ELLIPTA) 100-25 MCG/INH AEPB Inhale 1 puff into the lungs daily as needed (shortness of breath).     . furosemide (LASIX) 40 MG tablet Take 40 mg by mouth 2 (two) times daily with a meal.     . insulin glargine (LANTUS) 100 UNIT/ML injection Inject 40 Units into the skin at bedtime.     . insulin lispro (HUMALOG) 100 UNIT/ML injection Inject 20 Units into the skin 2 (two) times daily with breakfast and lunch. With breakfast and lunch     . metoprolol succinate (TOPROL-XL) 25 MG 24 hr tablet Take 25 mg by mouth daily.    . nitroGLYCERIN (NITROSTAT) 0.4 MG SL tablet Place 0.4 mg under the tongue every 5 (five) minutes as needed for chest pain.    . sodium bicarbonate 650 MG tablet Take 1,300 mg by mouth 2 (two) times daily.     No current facility-administered medications for this visit.      Past Surgical History:  Procedure Laterality Date  . ABDOMINAL HYSTERECTOMY  1975  . AV FISTULA PLACEMENT Left 11/13/2015   Procedure: ARTERIOVENOUS (AV) FISTULA CREATION UPPER ARM LEFT;  Surgeon: Serafina Mitchell, MD;  Location: [place];  Service: Vascular;  Laterality: Left;  . BASCILIC VEIN TRANSPOSITION Left 03/30/2016   Procedure: 2ND STAGE BASILIC VEIN TRANSPOSITION;  Surgeon: Conrad Johnson, MD;  Location: [place];  Service: Vascular;  Laterality: Left;  . COLONOSCOPY    . CORONARY ANGIOPLASTY WITH STENT PLACEMENT    . ROTATOR CUFF REPAIR Right      Allergies  Allergen Reactions  . Codeine Shortness Of Breath and Nausea And Vomiting  . Tramadol Nausea And Vomiting      Family History  Problem Relation Age of Onset  . Diabetes Mother   . Diabetes Father   . Heart failure Sister   . Diabetes Brother   . Heart disease Brother        before age 50  . Diabetes Maternal Grandmother   . Diabetes Maternal Grandfather   . Diabetes Paternal Grandmother   . Diabetes Paternal Grandfather   . Diabetes Sister   . Diabetes Sister   . Diabetes Brother      Social History Ms. Stenseth reports that she has never smoked. She has never used smokeless tobacco. Ms. Shisler reports that she does not  drink alcohol.   Review of Systems CONSTITUTIONAL: No weight loss, fever, chills, weakness or fatigue.  HEENT: Eyes: No visual loss, blurred vision, double vision or yellow sclerae.No hearing loss, sneezing, congestion, runny nose or sore throat.  SKIN: No rash or itching.  CARDIOVASCULAR:  RESPIRATORY: No shortness of breath, cough or sputum.  GASTROINTESTINAL: No anorexia, nausea, vomiting or diarrhea. No abdominal pain or blood.  GENITOURINARY: No burning on urination, no polyuria NEUROLOGICAL: No headache, dizziness, syncope, paralysis, ataxia, numbness or tingling in the extremities. No change in bowel or bladder control.  MUSCULOSKELETAL: No muscle, back pain, joint pain or stiffness.  LYMPHATICS: No enlarged nodes. No history of splenectomy.  PSYCHIATRIC: No history of depression or anxiety.  ENDOCRINOLOGIC: No reports of sweating, cold or heat intolerance. No polyuria or polydipsia.  Marland Kitchen   Physical Examination There were no vitals filed for this visit. There were no vitals filed for this visit.  Gen: resting comfortably, no acute distress HEENT: no scleral icterus, pupils equal round and reactive, no palptable cervical adenopathy,  CV Resp: Clear to auscultation bilaterally GI: abdomen is soft, non-tender, non-distended, normal bowel sounds, no hepatosplenomegaly MSK: extremities are warm, no edema.  Skin: warm, no rash Neuro:  no focal deficits Psych: appropriate affect   Diagnostic Studies 02/2004 cath HEMODYNAMIC DATA: 1. Central aortic pressure 128/75, mean 96. 2. Left ventricle 135/8. 3. No gradient pullback across aortic valve.  ANGIOGRAPHIC DATA: 1. Ventriculography was done in the RAO projection. Overall systolic function was vigorous. No segmental abnormalities or contraction were identified. 2. The left main coronary artery was free of critical disease and was very large caliber, being at least about 6 mm. 3. The left anterior  descending artery courses to the apex. The LAD has two fairly small diagonal branches a major septal perforator. In the mid vessel the vessel appears to take an intramyocardial course and a distal vessel wraps the apex. At the very proximal portion of the left anterior descending artery there is an eccentric plaque. This probably ranges in the range of about 40-50%. It does not appear to be flow limiting and we took multiple views of this in multiple angiographic projections. My estimate would be that it would not likely be flow-limiting. 4. There is a fairly large ramus intermedius vessel. This has about 40% ostial narrowing and then the mid vessel appears to be acentric with about 50% narrowing. 5. The circumflex is a dominant vessel. There is 50 to at most 60% narrowing in the mid vessel. This is very  eccentric and smooth with a residual lumen of greater than 2 mm likely, again, not representing flow- limiting disease. 6. The right coronary artery is a nondominant vessel and appears to be nonobstructive.  CONCLUSION: 1. Well-preserved left ventricular function. 2. Multiple lesions of the coronary arteries with none appearing to be high grade or flow-limiting in nature.  RECOMMENDATIONS: Without question the patient has evidence of coronary atherosclerosis. The vessels are very large in caliber. The mid LAD would be estimated at at least about 4 mm in size. Importantly, there are multiple areas of plaquing but none of these appear to be high grade. Based on this, aggressive risk factor reduction would be recommended. Fortunately, the patient does not smoke. Optimization of her hemoglobin A1C and control of lipids would be appropriate therapy at this time. I have discussed this case with Dr. Dannielle Burn and we will review again in further detail.   [facility] cath 04/2014 CORONARY  ANGIOGRAPHY: Culprit vessel intitial: 95% stenosis in the proximal aspect of the lesion 75% stenosis in the mid to distal aspect of the lesion. Lesion length 15 mm. Disease segment 18-20 mm. Type BII lesion. Initial flow was TIMI-3 flow. Culprit vessel post intervention: No residual stenosis. Good stent apposition. TIMI 3 flow.    02/2016 echo Study Conclusions  - Left ventricle: The cavity size was normal. Wall thickness was increased in a pattern of mild LVH. Systolic function was normal. The estimated ejection fraction was 55%. There is akinesis of the basalinferior myocardium. Features are consistent with a pseudonormal left ventricular filling pattern, with concomitant abnormal relaxation and increased filling pressure (grade 2 diastolic dysfunction). - Aortic valve: Mildly calcified annulus. Trileaflet; mildly calcified leaflets. There was trivial regurgitation. - Mitral valve: Calcified annulus. There was mild regurgitation. - Left atrium: The atrium was moderately dilated. - Right atrium: Central venous pressure (est): 8 mm Hg. - Tricuspid valve: There was mild regurgitation. - Pulmonary arteries: Systolic pressure was severely increased. PA peak pressure: 62 mm Hg (S). - Pericardium, extracardiac: There was no pericardial effusion.  Impressions:  - Mild LVH with LVEF 55%. Basal inferior akinesis. Grade 2 diastolic dysfunction. Moderate left atrial enlargement. Calcified mitral annulus with mild mitral regurgitation. Sclerotic aortic valve with trivial aortic regurgitation. Mild tricuspid regurgitation with evidence of severe pulmonary hypertension, PASP 62 mmHg.      Assessment and Plan  1. CAD  - no current symptoms. Continue current meds  2. COPD - continue inhalers  3. CKD IV - followed by neprhology  4. Hyperlipidemia - request labs from pcp. She will continue statin.   5. HTN - elevated in clinic - increase  norvasc to 10mg  daily.    Arnoldo Lenis, M.D.       Arnoldo Lenis, M.D.

## 2017-06-16 ENCOUNTER — Encounter (HOSPITAL_COMMUNITY)
Admission: RE | Admit: 2017-06-16 | Discharge: 2017-06-16 | Disposition: A | Discharge status: Home / Self Care | Payer: Medicare Other | Source: Ambulatory Visit | Attending: Nephrology | Admitting: Nephrology

## 2017-06-16 ENCOUNTER — Encounter (HOSPITAL_COMMUNITY): Payer: Medicare Other

## 2017-06-16 DIAGNOSIS — D631 Anemia in chronic kidney disease: Secondary | ICD-10-CM | POA: Insufficient documentation

## 2017-06-16 DIAGNOSIS — N184 Chronic kidney disease, stage 4 (severe): Secondary | ICD-10-CM | POA: Insufficient documentation

## 2017-06-16 NOTE — Progress Notes (Signed)
Patient missed [facility] appointment today.  Called her and she states that she fell yesterday and was sore and couldn't come today.  Rescheduled for tomorrow.

## 2017-06-17 ENCOUNTER — Encounter (HOSPITAL_COMMUNITY)
Admission: RE | Admit: 2017-06-17 | Discharge: 2017-06-17 | Disposition: A | Discharge status: Home / Self Care | Payer: Medicare Other | Source: Ambulatory Visit | Attending: Nephrology | Admitting: Nephrology

## 2017-06-17 ENCOUNTER — Encounter (HOSPITAL_COMMUNITY): Payer: Self-pay

## 2017-06-17 DIAGNOSIS — D631 Anemia in chronic kidney disease: Secondary | ICD-10-CM | POA: Diagnosis present

## 2017-06-17 DIAGNOSIS — N184 Chronic kidney disease, stage 4 (severe): Secondary | ICD-10-CM | POA: Diagnosis not present

## 2017-06-17 LAB — RENAL FUNCTION PANEL
Albumin: 3.5 g/dL (ref 3.5–5.0)
Anion gap: 12 (ref 5–15)
BUN: 106 mg/dL — ABNORMAL HIGH (ref 6–20)
CHLORIDE: 98 mmol/L — AB (ref 101–111)
CO2: 30 mmol/L (ref 22–32)
CREATININE: 3.9 mg/dL — AB (ref 0.44–1.00)
Calcium: 9.7 mg/dL (ref 8.9–10.3)
GFR, EST AFRICAN AMERICAN: 11 mL/min — AB (ref >60)
GFR, EST NON AFRICAN AMERICAN: 10 mL/min — AB (ref >60)
Glucose, Bld: 307 mg/dL — ABNORMAL HIGH (ref 65–99)
POTASSIUM: 4.4 mmol/L (ref 3.5–5.1)
Phosphorus: 3.7 mg/dL (ref 2.5–4.6)
Sodium: 140 mmol/L (ref 135–145)

## 2017-06-17 LAB — POCT HEMOGLOBIN-HEMACUE: HEMOGLOBIN: 12.5 g/dL (ref 12.0–15.0)

## 2017-06-17 LAB — IRON AND TIBC
IRON: 87 ug/dL (ref 28–170)
Saturation Ratios: 29 % (ref 10.4–31.8)
TIBC: 305 ug/dL (ref 250–450)
UIBC: 218 ug/dL

## 2017-06-17 LAB — FERRITIN: FERRITIN: 492 ng/mL — AB (ref 11–307)

## 2017-06-17 MED ORDER — EPOETIN ALFA 10000 UNIT/ML IJ SOLN: 10000.0000 [IU] | Freq: Once | INTRAMUSCULAR | Status: DC

## 2017-06-18 LAB — PTH, INTACT AND CALCIUM
CALCIUM TOTAL (PTH): 9.8 mg/dL (ref 8.7–10.3)
PTH: 330 pg/mL — ABNORMAL HIGH (ref 15–65)

## 2017-07-01 ENCOUNTER — Encounter (HOSPITAL_COMMUNITY): Admission: RE | Admit: 2017-07-01 | Payer: Medicare Other | Source: Ambulatory Visit

## 2017-07-01 ENCOUNTER — Encounter (HOSPITAL_COMMUNITY): Payer: Medicare Other

## 2017-07-05 ENCOUNTER — Encounter (HOSPITAL_COMMUNITY): Admission: RE | Admit: 2017-07-05 | Payer: Medicare Other | Source: Ambulatory Visit

## 2017-07-05 ENCOUNTER — Encounter (HOSPITAL_COMMUNITY): Payer: Medicare Other

## 2017-07-07 ENCOUNTER — Other Ambulatory Visit (HOSPITAL_COMMUNITY): Payer: Medicare Other

## 2017-07-07 ENCOUNTER — Ambulatory Visit (HOSPITAL_COMMUNITY): Payer: Medicare Other

## 2017-07-08 ENCOUNTER — Encounter (HOSPITAL_COMMUNITY)
Admission: RE | Admit: 2017-07-08 | Discharge: 2017-07-08 | Disposition: A | Discharge status: Home / Self Care | Payer: Medicare Other | Source: Ambulatory Visit | Attending: Nephrology | Admitting: Nephrology

## 2017-07-08 ENCOUNTER — Encounter (HOSPITAL_COMMUNITY): Payer: Self-pay

## 2017-07-08 DIAGNOSIS — N184 Chronic kidney disease, stage 4 (severe): Secondary | ICD-10-CM | POA: Diagnosis not present

## 2017-07-08 NOTE — Progress Notes (Signed)
Patient Hemocue 12.1 no procrit for today. Next appointment 07/22/2017 @ 1130

## 2017-07-11 ENCOUNTER — Encounter: Payer: Self-pay | Admitting: *Deleted

## 2017-07-11 ENCOUNTER — Encounter: Payer: Self-pay | Admitting: Cardiology

## 2017-07-11 ENCOUNTER — Ambulatory Visit: Payer: Medicare Other | Admitting: Cardiology

## 2017-07-11 VITALS — BP 155/55 | HR 62 | Ht 66.0 in | Wt 165.4 lb

## 2017-07-11 DIAGNOSIS — E782 Mixed hyperlipidemia: Secondary | ICD-10-CM | POA: Diagnosis not present

## 2017-07-11 DIAGNOSIS — N184 Chronic kidney disease, stage 4 (severe): Secondary | ICD-10-CM | POA: Diagnosis not present

## 2017-07-11 DIAGNOSIS — I10 Essential (primary) hypertension: Secondary | ICD-10-CM | POA: Diagnosis not present

## 2017-07-11 DIAGNOSIS — I2510 Atherosclerotic heart disease of native coronary artery without angina pectoris: Secondary | ICD-10-CM | POA: Diagnosis not present

## 2017-07-11 LAB — POCT HEMOGLOBIN-HEMACUE: Hemoglobin: 12.1 g/dL (ref 12.0–15.0)

## 2017-07-11 NOTE — Patient Instructions (Signed)
Your physician recommends that you schedule a follow-up appointment in: 2 MONTHS WITH DR BRANCH  Your physician recommends that you continue on your current medications as directed. Please refer to the Current Medication list given to you today.  Thank you for choosing [facility]!!    

## 2017-07-11 NOTE — Progress Notes (Signed)
Clinical Summary Donna Ochoa is a 84 y.o.female seen today for follow up of the following medical problems.   1. CAD - NSTEMI 04/2014 at [place], received DES to LCX. Echo at that time according to notes with normal LVEF.  - ASA allergy,notes indicate desensitization during 04/2014 admission at [facility]. Now taking ASA at home.    - no recent chest pain. No SOB/DOE - compliant with meds   2. COPD - compliant with inhalers.   3. Anemia - followed by pcp Dr Woody Seller.   4. CKD IV - followed by nephorology Dr Posey Pronto at [facility].    5. Chronic diastolic HF - echo 09/3714 LVEF 55%, grade II diastolic dysfunction, PASP 62, normal RV  - no recent edema  6. HTN - she is unsure of what happened to norvasc on her MAR.      Past Medical History:  Diagnosis Date  . Anemia   . Arthritis   . Asthma   . Chronic kidney disease    [facility]  . Coronary artery disease 04/2014   1 stent  . Diabetes mellitus without complication ([place])    type 2  . Dyspnea    when she has too much fluid  . Head injury with loss of consciousness ([place]) 2014  . History of blood transfusion   . Hypertension   . Pneumonia    03/29/16- years ago     Allergies  Allergen Reactions  . Codeine Shortness Of Breath and Nausea And Vomiting  . Tramadol Nausea And Vomiting     Current Outpatient Medications  Medication Sig Dispense Refill  . amLODipine (NORVASC) 10 MG tablet TAKE 1 TABLET ONCE DAILY. 15 tablet 0  . aspirin EC 81 MG tablet Take 81 mg by mouth daily.    Marland Kitchen atorvastatin (LIPITOR) 80 MG tablet TAKE 1 TABLET BY MOUTH AT BEDTIME. 30 tablet 6  . calcitRIOL (ROCALTROL) 0.25 MCG capsule Take 0.25 mcg by mouth daily.    . ergocalciferol (VITAMIN D2) 50000 units capsule Take 50,000 Units by mouth once a week. Mondays    . fluticasone furoate-vilanterol (BREO ELLIPTA) 100-25 MCG/INH AEPB Inhale 1 puff into the lungs daily as needed (shortness of breath).    . furosemide (LASIX)  40 MG tablet Take 40 mg by mouth 2 (two) times daily with a meal.     . insulin glargine (LANTUS) 100 UNIT/ML injection Inject 40 Units into the skin at bedtime.     . insulin lispro (HUMALOG) 100 UNIT/ML injection Inject 20 Units into the skin 2 (two) times daily with breakfast and lunch. With breakfast and lunch     . metoprolol succinate (TOPROL-XL) 25 MG 24 hr tablet Take 25 mg by mouth daily.    . nitroGLYCERIN (NITROSTAT) 0.4 MG SL tablet Place 0.4 mg under the tongue every 5 (five) minutes as needed for chest pain.    . sodium bicarbonate 650 MG tablet Take 1,300 mg by mouth 2 (two) times daily.     No current facility-administered medications for this visit.      Past Surgical History:  Procedure Laterality Date  . ABDOMINAL HYSTERECTOMY  1975  . AV FISTULA PLACEMENT Left 11/13/2015   Procedure: ARTERIOVENOUS (AV) FISTULA CREATION UPPER ARM LEFT;  Surgeon: Serafina Mitchell, MD;  Location: [facility];  Service: Vascular;  Laterality: Left;  . BASCILIC VEIN TRANSPOSITION Left 03/30/2016   Procedure: 2ND STAGE BASILIC VEIN TRANSPOSITION;  Surgeon: Conrad [place], MD;  Location: [place];  Service: Vascular;  Laterality: Left;  . COLONOSCOPY    . CORONARY ANGIOPLASTY WITH STENT PLACEMENT    . ROTATOR CUFF REPAIR Right      Allergies  Allergen Reactions  . Codeine Shortness Of Breath and Nausea And Vomiting  . Tramadol Nausea And Vomiting      Family History  Problem Relation Age of Onset  . Diabetes Mother   . Diabetes Father   . Heart failure Sister   . Diabetes Brother   . Heart disease Brother        before age 40  . Diabetes Maternal Grandmother   . Diabetes Maternal Grandfather   . Diabetes Paternal Grandmother   . Diabetes Paternal Grandfather   . Diabetes Sister   . Diabetes Sister   . Diabetes Brother      Social History Donna Ochoa reports that she has never smoked. She has never used smokeless tobacco. Donna Ochoa reports that she does not drink alcohol.   Review  of Systems CONSTITUTIONAL: No weight loss, fever, chills, weakness or fatigue.  HEENT: Eyes: No visual loss, blurred vision, double vision or yellow sclerae.No hearing loss, sneezing, congestion, runny nose or sore throat.  SKIN: No rash or itching.  CARDIOVASCULAR: per hpi RESPIRATORY: No shortness of breath, cough or sputum.  GASTROINTESTINAL: No anorexia, nausea, vomiting or diarrhea. No abdominal pain or blood.  GENITOURINARY: No burning on urination, no polyuria NEUROLOGICAL: No headache, dizziness, syncope, paralysis, ataxia, numbness or tingling in the extremities. No change in bowel or bladder control.  MUSCULOSKELETAL: No muscle, back pain, joint pain or stiffness.  LYMPHATICS: No enlarged nodes. No history of splenectomy.  PSYCHIATRIC: No history of depression or anxiety.  ENDOCRINOLOGIC: No reports of sweating, cold or heat intolerance. No polyuria or polydipsia.  Marland Kitchen   Physical Examination Vitals:   07/11/17 1457  BP: (!) 155/55  Pulse: 62  SpO2: 98%   Vitals:   07/11/17 1457  Weight: 165 lb 6.4 oz (75 kg)  Height: 5\' 6"  (1.676 m)    Gen: resting comfortably, no acute distress HEENT: no scleral icterus, pupils equal round and reactive, no palptable cervical adenopathy,  CV: RRR, no m/r/g, no jvd Resp: Clear to auscultation bilaterally GI: abdomen is soft, non-tender, non-distended, normal bowel sounds, no hepatosplenomegaly MSK: extremities are warm, no edema.  Skin: warm, no rash Neuro:  no focal deficits Psych: appropriate affect   Diagnostic Studies 02/2004 cath HEMODYNAMIC DATA: 1. Central aortic pressure 128/75, mean 96. 2. Left ventricle 135/8. 3. No gradient pullback across aortic valve.  ANGIOGRAPHIC DATA: 1. Ventriculography was done in the RAO projection. Overall systolic function was vigorous. No segmental abnormalities or contraction were identified. 2. The left main coronary artery was free of critical disease and was  very large caliber, being at least about 6 mm. 3. The left anterior descending artery courses to the apex. The LAD has two fairly small diagonal branches a major septal perforator. In the mid vessel the vessel appears to take an intramyocardial course and a distal vessel wraps the apex. At the very proximal portion of the left anterior descending artery there is an eccentric plaque. This probably ranges in the range of about 40-50%. It does not appear to be flow limiting and we took multiple views of this in multiple angiographic projections. My estimate would be that it would not likely be flow-limiting. 4. There is a fairly large ramus intermedius vessel. This has about 40% ostial narrowing and then the mid vessel appears to  be acentric with about 50% narrowing. 5. The circumflex is a dominant vessel. There is 50 to at most 60% narrowing in the mid vessel. This is very eccentric and smooth with a residual lumen of greater than 2 mm likely, again, not representing flow- limiting disease. 6. The right coronary artery is a nondominant vessel and appears to be nonobstructive.  CONCLUSION: 1. Well-preserved left ventricular function. 2. Multiple lesions of the coronary arteries with none appearing to be high grade or flow-limiting in nature.  RECOMMENDATIONS: Without question the patient has evidence of coronary atherosclerosis. The vessels are very large in caliber. The mid LAD would be estimated at at least about 4 mm in size. Importantly, there are multiple areas of plaquing but none of these appear to be high grade. Based on this, aggressive risk factor reduction would be recommended. Fortunately, the patient does not smoke. Optimization of her hemoglobin A1C and control of lipids would be appropriate therapy at this time. I have discussed this case with Dr. Dannielle Burn and we will review  again in further detail.   [facility] cath 04/2014 CORONARY ANGIOGRAPHY: Culprit vessel intitial: 95% stenosis in the proximal aspect of the lesion 75% stenosis in the mid to distal aspect of the lesion. Lesion length 15 mm. Disease segment 18-20 mm. Type BII lesion. Initial flow was TIMI-3 flow. Culprit vessel post intervention: No residual stenosis. Good stent apposition. TIMI 3 flow.    02/2016 echo Study Conclusions  - Left ventricle: The cavity size was normal. Wall thickness was increased in a pattern of mild LVH. Systolic function was normal. The estimated ejection fraction was 55%. There is akinesis of the basalinferior myocardium. Features are consistent with a pseudonormal left ventricular filling pattern, with concomitant abnormal relaxation and increased filling pressure (grade 2 diastolic dysfunction). - Aortic valve: Mildly calcified annulus. Trileaflet; mildly calcified leaflets. There was trivial regurgitation. - Mitral valve: Calcified annulus. There was mild regurgitation. - Left atrium: The atrium was moderately dilated. - Right atrium: Central venous pressure (est): 8 mm Hg. - Tricuspid valve: There was mild regurgitation. - Pulmonary arteries: Systolic pressure was severely increased. PA peak pressure: 62 mm Hg (S). - Pericardium, extracardiac: There was no pericardial effusion.  Impressions:  - Mild LVH with LVEF 55%. Basal inferior akinesis. Grade 2 diastolic dysfunction. Moderate left atrial enlargement. Calcified mitral annulus with mild mitral regurgitation. Sclerotic aortic valve with trivial aortic regurgitation. Mild tricuspid regurgitation with evidence of severe pulmonary hypertension, PASP 62 mmHg.     Assessment and Plan  1. CAD  - doing well without symptoms, continue current meds EKG today without acute ischemic changes  2. COPD - continue inhalers  3. CKD IV - followed by neprhology - limit cardiac  nephrtoxic drugs  4. Hyperlipidemia -unclear what happened to her statin, request notes from pcp  5. HTN - elevated in clinic -unclear what happened to her norvasc since last visit, request pcp notes   Unclear on prior med changes including her statin and norvasc, will see if can sort out from pcp and renal notes. She thinks she may have just run out, but is not sure if another provider stopped.        Arnoldo Lenis, M.D.

## 2017-07-17 ENCOUNTER — Encounter: Payer: Self-pay | Admitting: Cardiology

## 2017-07-21 MED ORDER — EPOETIN ALFA 10000 UNIT/ML IJ SOLN: 10000.0000 [IU] | Freq: Once | INTRAMUSCULAR | Status: DC

## 2017-07-22 ENCOUNTER — Encounter (HOSPITAL_COMMUNITY): Payer: Medicare Other

## 2017-07-22 ENCOUNTER — Encounter (HOSPITAL_COMMUNITY)
Admission: RE | Admit: 2017-07-22 | Discharge: 2017-07-22 | Disposition: A | Discharge status: Home / Self Care | Payer: Medicare Other | Source: Ambulatory Visit | Attending: Nephrology | Admitting: Nephrology

## 2017-07-22 DIAGNOSIS — D631 Anemia in chronic kidney disease: Secondary | ICD-10-CM | POA: Diagnosis present

## 2017-07-22 DIAGNOSIS — N184 Chronic kidney disease, stage 4 (severe): Secondary | ICD-10-CM | POA: Insufficient documentation

## 2017-07-22 LAB — RENAL FUNCTION PANEL
ALBUMIN: 3.3 g/dL — AB (ref 3.5–5.0)
ANION GAP: 12 (ref 5–15)
BUN: 88 mg/dL — AB (ref 8–23)
CALCIUM: 9.6 mg/dL (ref 8.9–10.3)
CO2: 29 mmol/L (ref 22–32)
Chloride: 98 mmol/L (ref 98–111)
Creatinine, Ser: 4.36 mg/dL — ABNORMAL HIGH (ref 0.44–1.00)
GFR calc Af Amer: 10 mL/min — ABNORMAL LOW (ref >60)
GFR, EST NON AFRICAN AMERICAN: 9 mL/min — AB (ref >60)
Glucose, Bld: 364 mg/dL — ABNORMAL HIGH (ref 70–99)
PHOSPHORUS: 3.5 mg/dL (ref 2.5–4.6)
POTASSIUM: 4.2 mmol/L (ref 3.5–5.1)
SODIUM: 139 mmol/L (ref 135–145)

## 2017-07-22 LAB — IRON AND TIBC
Iron: 91 ug/dL (ref 28–170)
Saturation Ratios: 34 % — ABNORMAL HIGH (ref 10.4–31.8)
TIBC: 270 ug/dL (ref 250–450)
UIBC: 179 ug/dL

## 2017-07-22 LAB — FERRITIN: FERRITIN: 801 ng/mL — AB (ref 11–307)

## 2017-07-22 LAB — POCT HEMOGLOBIN-HEMACUE: Hemoglobin: 10.8 g/dL — ABNORMAL LOW (ref 12.0–15.0)

## 2017-07-22 LAB — MAGNESIUM: MAGNESIUM: 2 mg/dL (ref 1.7–2.4)

## 2017-07-22 MED ORDER — EPOETIN ALFA 10000 UNIT/ML IJ SOLN
INTRAMUSCULAR | Status: AC
  Filled 2017-07-22: qty 1

## 2017-07-22 MED ORDER — EPOETIN ALFA 10000 UNIT/ML IJ SOLN
10000.0000 [IU] | Freq: Once | INTRAMUSCULAR | Status: AC
  Administered 2017-07-22: 10000 [IU] via SUBCUTANEOUS

## 2017-07-23 LAB — VITAMIN D 25 HYDROXY (VIT D DEFICIENCY, FRACTURES): VIT D 25 HYDROXY: 32.6 ng/mL (ref 30.0–100.0)

## 2017-08-05 ENCOUNTER — Encounter (HOSPITAL_COMMUNITY)
Admission: RE | Admit: 2017-08-05 | Discharge: 2017-08-05 | Disposition: A | Discharge status: Home / Self Care | Payer: Medicare Other | Source: Ambulatory Visit | Attending: Nephrology | Admitting: Nephrology

## 2017-08-05 ENCOUNTER — Encounter (HOSPITAL_COMMUNITY): Payer: Self-pay

## 2017-08-05 DIAGNOSIS — N184 Chronic kidney disease, stage 4 (severe): Secondary | ICD-10-CM | POA: Diagnosis not present

## 2017-08-05 LAB — POCT HEMOGLOBIN-HEMACUE: HEMOGLOBIN: 10.9 g/dL — AB (ref 12.0–15.0)

## 2017-08-05 MED ORDER — EPOETIN ALFA 10000 UNIT/ML IJ SOLN
10000.0000 [IU] | Freq: Once | INTRAMUSCULAR | Status: AC
  Administered 2017-08-05: 10000 [IU] via SUBCUTANEOUS

## 2017-08-05 MED ORDER — EPOETIN ALFA 10000 UNIT/ML IJ SOLN
INTRAMUSCULAR | Status: AC
  Filled 2017-08-05: qty 1

## 2017-08-19 ENCOUNTER — Encounter (HOSPITAL_COMMUNITY)
Admission: RE | Admit: 2017-08-19 | Discharge: 2017-08-19 | Disposition: A | Discharge status: Home / Self Care | Payer: Medicare Other | Source: Ambulatory Visit | Attending: Nephrology | Admitting: Nephrology

## 2017-08-19 DIAGNOSIS — N184 Chronic kidney disease, stage 4 (severe): Secondary | ICD-10-CM | POA: Diagnosis not present

## 2017-08-19 DIAGNOSIS — D631 Anemia in chronic kidney disease: Secondary | ICD-10-CM | POA: Diagnosis present

## 2017-08-19 LAB — RENAL FUNCTION PANEL
Albumin: 3.3 g/dL — ABNORMAL LOW (ref 3.5–5.0)
Anion gap: 10 (ref 5–15)
BUN: 88 mg/dL — ABNORMAL HIGH (ref 8–23)
CALCIUM: 9.7 mg/dL (ref 8.9–10.3)
CO2: 27 mmol/L (ref 22–32)
CREATININE: 4.18 mg/dL — AB (ref 0.44–1.00)
Chloride: 107 mmol/L (ref 98–111)
GFR, EST AFRICAN AMERICAN: 10 mL/min — AB (ref >60)
GFR, EST NON AFRICAN AMERICAN: 9 mL/min — AB (ref >60)
Glucose, Bld: 218 mg/dL — ABNORMAL HIGH (ref 70–99)
Phosphorus: 3.5 mg/dL (ref 2.5–4.6)
Potassium: 4 mmol/L (ref 3.5–5.1)
SODIUM: 144 mmol/L (ref 135–145)

## 2017-08-19 LAB — FERRITIN: FERRITIN: 616 ng/mL — AB (ref 11–307)

## 2017-08-19 LAB — IRON AND TIBC
IRON: 78 ug/dL (ref 28–170)
Saturation Ratios: 26 % (ref 10.4–31.8)
TIBC: 297 ug/dL (ref 250–450)
UIBC: 219 ug/dL

## 2017-08-19 LAB — MAGNESIUM: Magnesium: 1.9 mg/dL (ref 1.7–2.4)

## 2017-08-19 LAB — POCT HEMOGLOBIN-HEMACUE: Hemoglobin: 9.8 g/dL — ABNORMAL LOW (ref 12.0–15.0)

## 2017-08-19 MED ORDER — EPOETIN ALFA 10000 UNIT/ML IJ SOLN
10000.0000 [IU] | Freq: Once | INTRAMUSCULAR | Status: AC
  Administered 2017-08-19: 10000 [IU] via SUBCUTANEOUS

## 2017-08-19 MED ORDER — EPOETIN ALFA 10000 UNIT/ML IJ SOLN
INTRAMUSCULAR | Status: AC
  Filled 2017-08-19: qty 1

## 2017-08-26 ENCOUNTER — Encounter (HOSPITAL_COMMUNITY)
Admission: RE | Admit: 2017-08-26 | Discharge: 2017-08-26 | Disposition: A | Discharge status: Home / Self Care | Payer: Medicare Other | Source: Ambulatory Visit | Attending: Nephrology | Admitting: Nephrology

## 2017-08-26 DIAGNOSIS — N184 Chronic kidney disease, stage 4 (severe): Secondary | ICD-10-CM | POA: Diagnosis not present

## 2017-08-26 LAB — POCT HEMOGLOBIN-HEMACUE: Hemoglobin: 10.1 g/dL — ABNORMAL LOW (ref 12.0–15.0)

## 2017-08-26 MED ORDER — EPOETIN ALFA 10000 UNIT/ML IJ SOLN
10000.0000 [IU] | Freq: Once | INTRAMUSCULAR | Status: AC
  Administered 2017-08-26: 10000 [IU] via SUBCUTANEOUS

## 2017-08-26 MED ORDER — EPOETIN ALFA 10000 UNIT/ML IJ SOLN
INTRAMUSCULAR | Status: AC
  Filled 2017-08-26: qty 1

## 2017-09-09 ENCOUNTER — Other Ambulatory Visit (HOSPITAL_COMMUNITY): Payer: Medicare Other

## 2017-09-09 ENCOUNTER — Encounter (HOSPITAL_COMMUNITY)
Admission: RE | Admit: 2017-09-09 | Discharge: 2017-09-09 | Disposition: A | Discharge status: Home / Self Care | Payer: Medicare Other | Source: Ambulatory Visit | Attending: Nephrology | Admitting: Nephrology

## 2017-09-09 ENCOUNTER — Encounter (HOSPITAL_COMMUNITY): Payer: Self-pay

## 2017-09-09 DIAGNOSIS — N184 Chronic kidney disease, stage 4 (severe): Secondary | ICD-10-CM | POA: Diagnosis not present

## 2017-09-09 LAB — POCT HEMOGLOBIN-HEMACUE: Hemoglobin: 10 g/dL — ABNORMAL LOW (ref 12.0–15.0)

## 2017-09-09 MED ORDER — EPOETIN ALFA 20000 UNIT/ML IJ SOLN
20000.0000 [IU] | Freq: Once | INTRAMUSCULAR | Status: AC
  Administered 2017-09-09: 20000 [IU] via SUBCUTANEOUS

## 2017-09-09 MED ORDER — EPOETIN ALFA 20000 UNIT/ML IJ SOLN
INTRAMUSCULAR | Status: AC
  Filled 2017-09-09: qty 1

## 2017-09-14 ENCOUNTER — Encounter: Payer: Self-pay | Admitting: Cardiology

## 2017-09-14 ENCOUNTER — Ambulatory Visit: Payer: Medicare Other | Admitting: Cardiology

## 2017-09-14 VITALS — BP 138/67 | HR 73 | Ht 66.0 in | Wt 166.2 lb

## 2017-09-14 DIAGNOSIS — I10 Essential (primary) hypertension: Secondary | ICD-10-CM | POA: Diagnosis not present

## 2017-09-14 DIAGNOSIS — E782 Mixed hyperlipidemia: Secondary | ICD-10-CM | POA: Diagnosis not present

## 2017-09-14 MED ORDER — ATORVASTATIN CALCIUM 80 MG PO TABS: 80.0000 mg | ORAL_TABLET | Freq: Every day | ORAL | 1 refills | Status: AC

## 2017-09-14 MED ORDER — AMLODIPINE BESYLATE 10 MG PO TABS: 10.0000 mg | ORAL_TABLET | Freq: Every day | ORAL | 1 refills | Status: AC

## 2017-09-14 NOTE — Patient Instructions (Addendum)
Your physician wants you to follow-up in: [place] will receive a reminder letter in the mail two months in advance. If you don't receive a letter, please call our office to schedule the follow-up appointment.  Your physician has recommended you make the following change in your medication:   START AMLODIPINE 10 MG DAILY  Thank you for choosing [facility]!!

## 2017-09-14 NOTE — Progress Notes (Signed)
Clinical Summary Donna Ochoa is a 84 y.o.female seen today for follow up of the following medical problems.This is a focused visit on her history of HTN and hyperlipidemia.    1. HTN - bp was elevated last visit. She was no longer taking her norvasc, she was unsure if she had lost it or if one of her providers had stopped it - was able to review records from renal, at that visit she was on norvasc 10mg  daily with well controlled bp's, no mention of stopping.    2. Hyperlipidemia - she also stopped her atorvastatin for unclear reasons. I do not see any documnetation nor is she aware of any of her proviers intending for this.   Past Medical History:  Diagnosis Date  . Anemia   . Arthritis   . Asthma   . Chronic kidney disease    [facility]  . Coronary artery disease 04/2014   1 stent  . Diabetes mellitus without complication ([place])    type 2  . Dyspnea    when she has too much fluid  . Head injury with loss of consciousness ([place]) 2014  . History of blood transfusion   . Hypertension   . Pneumonia    03/29/16- years ago     Allergies  Allergen Reactions  . Codeine Shortness Of Breath and Nausea And Vomiting  . Tramadol Nausea And Vomiting     Current Outpatient Medications  Medication Sig Dispense Refill  . aspirin EC 81 MG tablet Take 81 mg by mouth daily.    Marland Kitchen atorvastatin (LIPITOR) 80 MG tablet Take 1 tablet by mouth at bedtime.    . furosemide (LASIX) 80 MG tablet Take 160 mg by mouth 2 (two) times daily.    . insulin lispro (HUMALOG) 100 UNIT/ML injection Inject 20 Units into the skin 2 (two) times daily with breakfast and lunch. With breakfast and lunch     . nitroGLYCERIN (NITROSTAT) 0.4 MG SL tablet Place 0.4 mg under the tongue every 5 (five) minutes as needed for chest pain.    . sodium bicarbonate 650 MG tablet Take 1,300 mg by mouth 2 (two) times daily.     No current facility-administered medications for this visit.      Past Surgical  History:  Procedure Laterality Date  . ABDOMINAL HYSTERECTOMY  1975  . AV FISTULA PLACEMENT Left 11/13/2015   Procedure: ARTERIOVENOUS (AV) FISTULA CREATION UPPER ARM LEFT;  Surgeon: Serafina Mitchell, MD;  Location: [facility];  Service: Vascular;  Laterality: Left;  . BASCILIC VEIN TRANSPOSITION Left 03/30/2016   Procedure: 2ND STAGE BASILIC VEIN TRANSPOSITION;  Surgeon: Conrad Brookwood, MD;  Location: [facility];  Service: Vascular;  Laterality: Left;  . COLONOSCOPY    . CORONARY ANGIOPLASTY WITH STENT PLACEMENT    . ROTATOR CUFF REPAIR Right      Allergies  Allergen Reactions  . Codeine Shortness Of Breath and Nausea And Vomiting  . Tramadol Nausea And Vomiting      Family History  Problem Relation Age of Onset  . Diabetes Mother   . Diabetes Father   . Heart failure Sister   . Diabetes Brother   . Heart disease Brother        before age 6  . Diabetes Maternal Grandmother   . Diabetes Maternal Grandfather   . Diabetes Paternal Grandmother   . Diabetes Paternal Grandfather   . Diabetes Sister   . Diabetes Sister   . Diabetes Brother  Social History Ms. Williamsen reports that she has never smoked. She has never used smokeless tobacco. Ms. Sensabaugh reports that she does not drink alcohol.   Review of Systems CONSTITUTIONAL: No weight loss, fever, chills, weakness or fatigue.  HEENT: Eyes: No visual loss, blurred vision, double vision or yellow sclerae.No hearing loss, sneezing, congestion, runny nose or sore throat.  SKIN: No rash or itching.  CARDIOVASCULAR: per hpi RESPIRATORY: No shortness of breath, cough or sputum.  GASTROINTESTINAL: No anorexia, nausea, vomiting or diarrhea. No abdominal pain or blood.  GENITOURINARY: No burning on urination, no polyuria NEUROLOGICAL: No headache, dizziness, syncope, paralysis, ataxia, numbness or tingling in the extremities. No change in bowel or bladder control.  MUSCULOSKELETAL: No muscle, back pain, joint pain or stiffness.    LYMPHATICS: No enlarged nodes. No history of splenectomy.  PSYCHIATRIC: No history of depression or anxiety.  ENDOCRINOLOGIC: No reports of sweating, cold or heat intolerance. No polyuria or polydipsia.  Marland Kitchen   Physical Examination Vitals:   09/14/17 1254  BP: 138/67  Pulse: 73   Filed Weights   09/14/17 1254  Weight: 166 lb 3.2 oz (75.4 kg)    Gen: resting comfortably, no acute distress HEENT: no scleral icterus, pupils equal round and reactive, no palptable cervical adenopathy,  CV: RRR, no m/r/g, no jvd Resp: Clear to auscultation bilaterally GI: abdomen is soft, non-tender, non-distended, normal bowel sounds, no hepatosplenomegaly MSK: extremities are warm, no edema.  Skin: warm, no rash Neuro:  no focal deficits Psych: appropriate affect   Diagnostic Studies 02/2004 cath HEMODYNAMIC DATA: 1. Central aortic pressure 128/75, mean 96. 2. Left ventricle 135/8. 3. No gradient pullback across aortic valve.  ANGIOGRAPHIC DATA: 1. Ventriculography was done in the RAO projection. Overall systolic function was vigorous. No segmental abnormalities or contraction were identified. 2. The left main coronary artery was free of critical disease and was very large caliber, being at least about 6 mm. 3. The left anterior descending artery courses to the apex. The LAD has two fairly small diagonal branches a major septal perforator. In the mid vessel the vessel appears to take an intramyocardial course and a distal vessel wraps the apex. At the very proximal portion of the left anterior descending artery there is an eccentric plaque. This probably ranges in the range of about 40-50%. It does not appear to be flow limiting and we took multiple views of this in multiple angiographic projections. My estimate would be that it would not likely be flow-limiting. 4. There is a fairly large ramus intermedius vessel. This has  about 40% ostial narrowing and then the mid vessel appears to be acentric with about 50% narrowing. 5. The circumflex is a dominant vessel. There is 50 to at most 60% narrowing in the mid vessel. This is very eccentric and smooth with a residual lumen of greater than 2 mm likely, again, not representing flow- limiting disease. 6. The right coronary artery is a nondominant vessel and appears to be nonobstructive.  CONCLUSION: 1. Well-preserved left ventricular function. 2. Multiple lesions of the coronary arteries with none appearing to be high grade or flow-limiting in nature.  RECOMMENDATIONS: Without question the patient has evidence of coronary atherosclerosis. The vessels are very large in caliber. The mid LAD would be estimated at at least about 4 mm in size. Importantly, there are multiple areas of plaquing but none of these appear to be high grade. Based on this, aggressive risk factor reduction would be recommended. Fortunately, the patient does not smoke.  Optimization of her hemoglobin A1C and control of lipids would be appropriate therapy at this time. I have discussed this case with Dr. Dannielle Burn and we will review again in further detail.   [facility] cath 04/2014 CORONARY ANGIOGRAPHY: Culprit vessel intitial: 95% stenosis in the proximal aspect of the lesion 75% stenosis in the mid to distal aspect of the lesion. Lesion length 15 mm. Disease segment 18-20 mm. Type BII lesion. Initial flow was TIMI-3 flow. Culprit vessel post intervention: No residual stenosis. Good stent apposition. TIMI 3 flow.    02/2016 echo Study Conclusions  - Left ventricle: The cavity size was normal. Wall thickness was increased in a pattern of mild LVH. Systolic function was normal. The estimated ejection fraction was 55%. There is akinesis of the basalinferior myocardium. Features are consistent with a pseudonormal left  ventricular filling pattern, with concomitant abnormal relaxation and increased filling pressure (grade 2 diastolic dysfunction). - Aortic valve: Mildly calcified annulus. Trileaflet; mildly calcified leaflets. There was trivial regurgitation. - Mitral valve: Calcified annulus. There was mild regurgitation. - Left atrium: The atrium was moderately dilated. - Right atrium: Central venous pressure (est): 8 mm Hg. - Tricuspid valve: There was mild regurgitation. - Pulmonary arteries: Systolic pressure was severely increased. PA peak pressure: 62 mm Hg (S). - Pericardium, extracardiac: There was no pericardial effusion.  Impressions:  - Mild LVH with LVEF 55%. Basal inferior akinesis. Grade 2 diastolic dysfunction. Moderate left atrial enlargement. Calcified mitral annulus with mild mitral regurgitation. Sclerotic aortic valve with trivial aortic regurgitation. Mild tricuspid regurgitation with evidence of severe pulmonary hypertension, PASP 62 mmHg.        Assessment and Plan   1. HTN - bp above goal of 130/80, we will restart her norvasc 10mg  daily   2. Hyperlipidemia - restart her atorvastatin 80mg  daily, appears she just did not fill from her pharmacy   F/u 6 months      Arnoldo Lenis, M.D

## 2017-09-23 ENCOUNTER — Encounter (HOSPITAL_COMMUNITY)
Admission: RE | Admit: 2017-09-23 | Discharge: 2017-09-23 | Disposition: A | Discharge status: Home / Self Care | Payer: Medicare Other | Source: Ambulatory Visit | Attending: Nephrology | Admitting: Nephrology

## 2017-09-23 ENCOUNTER — Encounter (HOSPITAL_COMMUNITY): Payer: Self-pay

## 2017-09-23 DIAGNOSIS — N184 Chronic kidney disease, stage 4 (severe): Secondary | ICD-10-CM | POA: Diagnosis present

## 2017-09-23 DIAGNOSIS — D631 Anemia in chronic kidney disease: Secondary | ICD-10-CM | POA: Insufficient documentation

## 2017-09-23 LAB — RENAL FUNCTION PANEL
Albumin: 3.8 g/dL (ref 3.5–5.0)
Anion gap: 14 (ref 5–15)
BUN: 92 mg/dL — AB (ref 8–23)
CALCIUM: 9.7 mg/dL (ref 8.9–10.3)
CO2: 24 mmol/L (ref 22–32)
CREATININE: 4.64 mg/dL — AB (ref 0.44–1.00)
Chloride: 104 mmol/L (ref 98–111)
GFR, EST AFRICAN AMERICAN: 9 mL/min — AB (ref >60)
GFR, EST NON AFRICAN AMERICAN: 8 mL/min — AB (ref >60)
Glucose, Bld: 223 mg/dL — ABNORMAL HIGH (ref 70–99)
Phosphorus: 3.6 mg/dL (ref 2.5–4.6)
Potassium: 4.2 mmol/L (ref 3.5–5.1)
SODIUM: 142 mmol/L (ref 135–145)

## 2017-09-23 LAB — IRON AND TIBC
IRON: 39 ug/dL (ref 28–170)
Saturation Ratios: 13 % (ref 10.4–31.8)
TIBC: 294 ug/dL (ref 250–450)
UIBC: 255 ug/dL

## 2017-09-23 LAB — POCT HEMOGLOBIN-HEMACUE: HEMOGLOBIN: 10.7 g/dL — AB (ref 12.0–15.0)

## 2017-09-23 LAB — FERRITIN: FERRITIN: 523 ng/mL — AB (ref 11–307)

## 2017-09-23 MED ORDER — EPOETIN ALFA 20000 UNIT/ML IJ SOLN
20000.0000 [IU] | INTRAMUSCULAR | Status: DC
  Administered 2017-09-23: 20000 [IU] via SUBCUTANEOUS
  Filled 2017-09-23: qty 1

## 2017-09-23 NOTE — Progress Notes (Signed)
Patient states that her asthma has flared up over the past few days.  States she is out of her inhaler and did not use nebulizer this am.  Advised her to contact PCP, and use neb in the meantime.  In addition a family member in the home has started using e cigarettes.  Advised her to ask them not to use in the house.  Verbalized understanding.  VS wnl at this visit.

## 2017-09-24 LAB — PTH, INTACT AND CALCIUM
Calcium, Total (PTH): 10 mg/dL (ref 8.7–10.3)
PTH: 283 pg/mL — ABNORMAL HIGH (ref 15–65)

## 2017-10-07 ENCOUNTER — Encounter (HOSPITAL_COMMUNITY)
Admission: RE | Admit: 2017-10-07 | Discharge: 2017-10-07 | Disposition: A | Discharge status: Home / Self Care | Payer: Medicare Other | Source: Ambulatory Visit | Attending: Nephrology | Admitting: Nephrology

## 2017-10-07 ENCOUNTER — Encounter (HOSPITAL_COMMUNITY): Payer: Self-pay

## 2017-10-07 DIAGNOSIS — N184 Chronic kidney disease, stage 4 (severe): Secondary | ICD-10-CM | POA: Diagnosis not present

## 2017-10-07 LAB — POCT HEMOGLOBIN-HEMACUE: HEMOGLOBIN: 10.5 g/dL — AB (ref 12.0–15.0)

## 2017-10-07 MED ORDER — EPOETIN ALFA 20000 UNIT/ML IJ SOLN
20000.0000 [IU] | INTRAMUSCULAR | Status: DC
  Administered 2017-10-07: 20000 [IU] via SUBCUTANEOUS

## 2017-10-07 MED ORDER — EPOETIN ALFA 20000 UNIT/ML IJ SOLN
INTRAMUSCULAR | Status: AC
  Filled 2017-10-07: qty 1

## 2017-10-12 ENCOUNTER — Encounter (HOSPITAL_COMMUNITY): Admission: RE | Admit: 2017-10-12 | Payer: Medicare Other | Source: Ambulatory Visit

## 2017-10-14 ENCOUNTER — Encounter (HOSPITAL_COMMUNITY)
Admission: RE | Admit: 2017-10-14 | Discharge: 2017-10-14 | Disposition: A | Discharge status: Home / Self Care | Payer: Medicare Other | Source: Ambulatory Visit | Attending: Nephrology | Admitting: Nephrology

## 2017-10-14 DIAGNOSIS — N184 Chronic kidney disease, stage 4 (severe): Secondary | ICD-10-CM | POA: Diagnosis present

## 2017-10-14 DIAGNOSIS — D631 Anemia in chronic kidney disease: Secondary | ICD-10-CM | POA: Insufficient documentation

## 2017-10-14 MED ORDER — SODIUM CHLORIDE 0.9 % IV SOLN
Freq: Once | INTRAVENOUS | Status: AC
  Administered 2017-10-14: 10:00:00 via INTRAVENOUS

## 2017-10-14 MED ORDER — SODIUM CHLORIDE 0.9 % IV SOLN
510.0000 mg | Freq: Once | INTRAVENOUS | Status: AC
  Administered 2017-10-14: 510 mg via INTRAVENOUS
  Filled 2017-10-14: qty 17

## 2017-10-20 ENCOUNTER — Other Ambulatory Visit (HOSPITAL_COMMUNITY): Payer: Medicare Other

## 2017-10-20 ENCOUNTER — Ambulatory Visit (HOSPITAL_COMMUNITY): Payer: Medicare Other

## 2017-10-21 ENCOUNTER — Encounter (HOSPITAL_COMMUNITY)
Admission: RE | Admit: 2017-10-21 | Discharge: 2017-10-21 | Disposition: A | Discharge status: Home / Self Care | Payer: Medicare Other | Source: Ambulatory Visit | Attending: Nephrology | Admitting: Nephrology

## 2017-10-21 ENCOUNTER — Encounter (HOSPITAL_COMMUNITY): Payer: Medicare Other

## 2017-10-21 ENCOUNTER — Encounter (HOSPITAL_COMMUNITY): Payer: Self-pay

## 2017-10-21 DIAGNOSIS — N184 Chronic kidney disease, stage 4 (severe): Secondary | ICD-10-CM | POA: Diagnosis not present

## 2017-10-21 LAB — RENAL FUNCTION PANEL
ALBUMIN: 3.5 g/dL (ref 3.5–5.0)
ANION GAP: 9 (ref 5–15)
BUN: 81 mg/dL — ABNORMAL HIGH (ref 8–23)
CALCIUM: 9.5 mg/dL (ref 8.9–10.3)
CO2: 25 mmol/L (ref 22–32)
CREATININE: 4.51 mg/dL — AB (ref 0.44–1.00)
Chloride: 107 mmol/L (ref 98–111)
GFR, EST AFRICAN AMERICAN: 10 mL/min — AB (ref >60)
GFR, EST NON AFRICAN AMERICAN: 8 mL/min — AB (ref >60)
Glucose, Bld: 259 mg/dL — ABNORMAL HIGH (ref 70–99)
PHOSPHORUS: 4.4 mg/dL (ref 2.5–4.6)
Potassium: 4.5 mmol/L (ref 3.5–5.1)
SODIUM: 141 mmol/L (ref 135–145)

## 2017-10-21 LAB — IRON AND TIBC
IRON: 98 ug/dL (ref 28–170)
SATURATION RATIOS: 33 % — AB (ref 10.4–31.8)
TIBC: 299 ug/dL (ref 250–450)
UIBC: 201 ug/dL

## 2017-10-21 LAB — POCT HEMOGLOBIN-HEMACUE: Hemoglobin: 10.8 g/dL — ABNORMAL LOW (ref 12.0–15.0)

## 2017-10-21 LAB — FERRITIN: FERRITIN: 782 ng/mL — AB (ref 11–307)

## 2017-10-21 MED ORDER — SODIUM CHLORIDE 0.9 % IV SOLN
510.0000 mg | Freq: Once | INTRAVENOUS | Status: DC
  Filled 2017-10-21: qty 17

## 2017-10-21 MED ORDER — EPOETIN ALFA 20000 UNIT/ML IJ SOLN
20000.0000 [IU] | Freq: Once | INTRAMUSCULAR | Status: AC
  Administered 2017-10-21: 20000 [IU] via SUBCUTANEOUS

## 2017-10-21 MED ORDER — SODIUM CHLORIDE 0.9 % IV SOLN
Freq: Once | INTRAVENOUS | Status: AC
  Administered 2017-10-21: 10:00:00 via INTRAVENOUS

## 2017-10-21 MED ORDER — EPOETIN ALFA 20000 UNIT/ML IJ SOLN
INTRAMUSCULAR | Status: AC
  Filled 2017-10-21: qty 1

## 2017-10-21 NOTE — Progress Notes (Signed)
IV sites infiltrated x 3 prior to feraheme infusion, therefore patient elected to be scheduled for next Friday.

## 2017-10-28 ENCOUNTER — Encounter (HOSPITAL_COMMUNITY)
Admission: RE | Admit: 2017-10-28 | Discharge: 2017-10-28 | Disposition: A | Discharge status: Home / Self Care | Payer: Medicare Other | Source: Ambulatory Visit | Attending: Nephrology | Admitting: Nephrology

## 2017-10-28 ENCOUNTER — Ambulatory Visit (HOSPITAL_COMMUNITY): Payer: Medicare Other

## 2017-10-28 ENCOUNTER — Encounter (HOSPITAL_COMMUNITY): Payer: Self-pay

## 2017-10-28 ENCOUNTER — Other Ambulatory Visit (HOSPITAL_COMMUNITY): Payer: Medicare Other

## 2017-10-28 DIAGNOSIS — N184 Chronic kidney disease, stage 4 (severe): Secondary | ICD-10-CM | POA: Diagnosis not present

## 2017-10-28 MED ORDER — SODIUM CHLORIDE 0.9 % IV SOLN
Freq: Once | INTRAVENOUS | Status: AC
  Administered 2017-10-28: 10:00:00 via INTRAVENOUS

## 2017-10-28 MED ORDER — SODIUM CHLORIDE 0.9 % IV SOLN
510.0000 mg | Freq: Once | INTRAVENOUS | Status: AC
  Administered 2017-10-28: 510 mg via INTRAVENOUS
  Filled 2017-10-28: qty 17

## 2017-11-04 ENCOUNTER — Encounter (HOSPITAL_COMMUNITY)
Admission: RE | Admit: 2017-11-04 | Payer: Medicare Other | Source: Ambulatory Visit | Attending: Orthopedic Surgery | Admitting: Orthopedic Surgery

## 2017-11-04 ENCOUNTER — Encounter (HOSPITAL_COMMUNITY): Admission: RE | Admit: 2017-11-04 | Payer: Medicare Other | Source: Ambulatory Visit

## 2017-11-07 ENCOUNTER — Encounter (HOSPITAL_COMMUNITY): Payer: Self-pay

## 2017-11-07 ENCOUNTER — Encounter (HOSPITAL_COMMUNITY)
Admission: RE | Admit: 2017-11-07 | Discharge: 2017-11-07 | Disposition: A | Discharge status: Home / Self Care | Payer: Medicare Other | Source: Ambulatory Visit | Attending: Nephrology | Admitting: Nephrology

## 2017-11-07 DIAGNOSIS — N184 Chronic kidney disease, stage 4 (severe): Secondary | ICD-10-CM | POA: Diagnosis not present

## 2017-11-07 LAB — POCT HEMOGLOBIN-HEMACUE: HEMOGLOBIN: 12.5 g/dL (ref 12.0–15.0)

## 2017-11-07 MED ORDER — EPOETIN ALFA 20000 UNIT/ML IJ SOLN: 20000.0000 [IU] | Freq: Once | INTRAMUSCULAR | Status: DC

## 2017-11-22 ENCOUNTER — Encounter (HOSPITAL_COMMUNITY): Payer: Self-pay

## 2017-11-22 ENCOUNTER — Encounter (HOSPITAL_COMMUNITY)
Admission: RE | Admit: 2017-11-22 | Discharge: 2017-11-22 | Disposition: A | Discharge status: Home / Self Care | Payer: Medicare Other | Source: Ambulatory Visit | Attending: Nephrology | Admitting: Nephrology

## 2017-11-22 DIAGNOSIS — D631 Anemia in chronic kidney disease: Secondary | ICD-10-CM | POA: Insufficient documentation

## 2017-11-22 DIAGNOSIS — N184 Chronic kidney disease, stage 4 (severe): Secondary | ICD-10-CM | POA: Diagnosis not present

## 2017-11-22 LAB — POCT HEMOGLOBIN-HEMACUE: HEMOGLOBIN: 10.9 g/dL — AB (ref 12.0–15.0)

## 2017-11-22 LAB — IRON AND TIBC
Iron: 117 ug/dL (ref 28–170)
SATURATION RATIOS: 43 % — AB (ref 10.4–31.8)
TIBC: 275 ug/dL (ref 250–450)
UIBC: 158 ug/dL

## 2017-11-22 LAB — FERRITIN: Ferritin: 987 ng/mL — ABNORMAL HIGH (ref 11–307)

## 2017-11-22 MED ORDER — EPOETIN ALFA 20000 UNIT/ML IJ SOLN
20000.0000 [IU] | Freq: Once | INTRAMUSCULAR | Status: AC
  Administered 2017-11-22: 20000 [IU] via SUBCUTANEOUS
  Filled 2017-11-22: qty 1

## 2017-12-06 ENCOUNTER — Encounter (HOSPITAL_COMMUNITY): Payer: Self-pay

## 2017-12-06 ENCOUNTER — Encounter (HOSPITAL_COMMUNITY)
Admission: RE | Admit: 2017-12-06 | Discharge: 2017-12-06 | Disposition: A | Discharge status: Home / Self Care | Payer: Medicare Other | Source: Ambulatory Visit | Attending: Nephrology | Admitting: Nephrology

## 2017-12-06 DIAGNOSIS — N184 Chronic kidney disease, stage 4 (severe): Secondary | ICD-10-CM | POA: Diagnosis not present

## 2017-12-06 LAB — POCT HEMOGLOBIN-HEMACUE: Hemoglobin: 10.6 g/dL — ABNORMAL LOW (ref 12.0–15.0)

## 2017-12-06 MED ORDER — EPOETIN ALFA 20000 UNIT/ML IJ SOLN
20000.0000 [IU] | Freq: Once | INTRAMUSCULAR | Status: AC
  Administered 2017-12-06: 20000 [IU] via SUBCUTANEOUS
  Filled 2017-12-06: qty 1

## 2017-12-19 ENCOUNTER — Encounter (HOSPITAL_COMMUNITY)
Admission: RE | Admit: 2017-12-19 | Discharge: 2017-12-19 | Disposition: A | Discharge status: Home / Self Care | Payer: Medicare Other | Source: Ambulatory Visit | Attending: Nephrology | Admitting: Nephrology

## 2017-12-19 ENCOUNTER — Encounter (HOSPITAL_COMMUNITY): Payer: Self-pay

## 2017-12-19 DIAGNOSIS — N184 Chronic kidney disease, stage 4 (severe): Secondary | ICD-10-CM | POA: Diagnosis present

## 2017-12-19 DIAGNOSIS — D631 Anemia in chronic kidney disease: Secondary | ICD-10-CM | POA: Diagnosis not present

## 2017-12-19 LAB — IRON AND TIBC
Iron: 69 ug/dL (ref 28–170)
Saturation Ratios: 25 % (ref 10.4–31.8)
TIBC: 271 ug/dL (ref 250–450)
UIBC: 202 ug/dL

## 2017-12-19 LAB — FERRITIN: Ferritin: 633 ng/mL — ABNORMAL HIGH (ref 11–307)

## 2017-12-19 LAB — RENAL FUNCTION PANEL
Albumin: 3.6 g/dL (ref 3.5–5.0)
Anion gap: 12 (ref 5–15)
BUN: 100 mg/dL — ABNORMAL HIGH (ref 8–23)
CO2: 19 mmol/L — ABNORMAL LOW (ref 22–32)
Calcium: 9.5 mg/dL (ref 8.9–10.3)
Chloride: 113 mmol/L — ABNORMAL HIGH (ref 98–111)
Creatinine, Ser: 4.16 mg/dL — ABNORMAL HIGH (ref 0.44–1.00)
GFR calc Af Amer: 11 mL/min — ABNORMAL LOW (ref >60)
GFR calc non Af Amer: 9 mL/min — ABNORMAL LOW (ref >60)
Glucose, Bld: 196 mg/dL — ABNORMAL HIGH (ref 70–99)
Phosphorus: 4.2 mg/dL (ref 2.5–4.6)
Potassium: 3.9 mmol/L (ref 3.5–5.1)
Sodium: 144 mmol/L (ref 135–145)

## 2017-12-19 LAB — POCT HEMOGLOBIN-HEMACUE: Hemoglobin: 11.4 g/dL — ABNORMAL LOW (ref 12.0–15.0)

## 2017-12-19 MED ORDER — EPOETIN ALFA 20000 UNIT/ML IJ SOLN
INTRAMUSCULAR | Status: AC
  Filled 2017-12-19: qty 1

## 2017-12-19 MED ORDER — EPOETIN ALFA 20000 UNIT/ML IJ SOLN: 20000.0000 [IU] | Freq: Once | INTRAMUSCULAR | Status: DC

## 2018-01-02 ENCOUNTER — Encounter (HOSPITAL_COMMUNITY)
Admission: RE | Admit: 2018-01-02 | Discharge: 2018-01-02 | Disposition: A | Discharge status: Home / Self Care | Payer: Medicare Other | Source: Ambulatory Visit | Attending: Nephrology | Admitting: Nephrology

## 2018-01-02 ENCOUNTER — Encounter (HOSPITAL_COMMUNITY): Payer: Self-pay

## 2018-01-02 DIAGNOSIS — N184 Chronic kidney disease, stage 4 (severe): Secondary | ICD-10-CM | POA: Diagnosis not present

## 2018-01-02 LAB — POCT HEMOGLOBIN-HEMACUE: HEMOGLOBIN: 11.6 g/dL — AB (ref 12.0–15.0)

## 2018-01-02 MED ORDER — EPOETIN ALFA 20000 UNIT/ML IJ SOLN
20000.0000 [IU] | Freq: Once | INTRAMUSCULAR | Status: AC
  Administered 2018-01-02: 20000 [IU] via SUBCUTANEOUS

## 2018-01-02 MED ORDER — EPOETIN ALFA 20000 UNIT/ML IJ SOLN
INTRAMUSCULAR | Status: AC
  Filled 2018-01-02: qty 1

## 2018-01-16 ENCOUNTER — Encounter (HOSPITAL_COMMUNITY)
Admission: RE | Admit: 2018-01-16 | Discharge: 2018-01-16 | Disposition: A | Discharge status: Home / Self Care | Payer: Medicare Other | Source: Ambulatory Visit | Attending: Nephrology | Admitting: Nephrology

## 2018-01-16 DIAGNOSIS — D631 Anemia in chronic kidney disease: Secondary | ICD-10-CM | POA: Insufficient documentation

## 2018-01-16 DIAGNOSIS — N184 Chronic kidney disease, stage 4 (severe): Secondary | ICD-10-CM | POA: Diagnosis present

## 2018-01-16 LAB — RENAL FUNCTION PANEL
Albumin: 3.5 g/dL (ref 3.5–5.0)
Anion gap: 13 (ref 5–15)
BUN: 100 mg/dL — ABNORMAL HIGH (ref 8–23)
CO2: 22 mmol/L (ref 22–32)
Calcium: 9.8 mg/dL (ref 8.9–10.3)
Chloride: 107 mmol/L (ref 98–111)
Creatinine, Ser: 3.67 mg/dL — ABNORMAL HIGH (ref 0.44–1.00)
GFR calc Af Amer: 13 mL/min — ABNORMAL LOW (ref >60)
GFR calc non Af Amer: 11 mL/min — ABNORMAL LOW (ref >60)
Glucose, Bld: 213 mg/dL — ABNORMAL HIGH (ref 70–99)
Phosphorus: 3.9 mg/dL (ref 2.5–4.6)
Potassium: 3.8 mmol/L (ref 3.5–5.1)
Sodium: 142 mmol/L (ref 135–145)

## 2018-01-16 LAB — IRON AND TIBC
Iron: 87 ug/dL (ref 28–170)
Saturation Ratios: 33 % — ABNORMAL HIGH (ref 10.4–31.8)
TIBC: 267 ug/dL (ref 250–450)
UIBC: 180 ug/dL

## 2018-01-16 LAB — FERRITIN: Ferritin: 1067 ng/mL — ABNORMAL HIGH (ref 11–307)

## 2018-01-16 LAB — POCT HEMOGLOBIN-HEMACUE: Hemoglobin: 11.7 g/dL — ABNORMAL LOW (ref 12.0–15.0)

## 2018-01-16 MED ORDER — EPOETIN ALFA 20000 UNIT/ML IJ SOLN
INTRAMUSCULAR | Status: AC
  Filled 2018-01-16: qty 1

## 2018-01-16 MED ORDER — EPOETIN ALFA 20000 UNIT/ML IJ SOLN
20000.0000 [IU] | INTRAMUSCULAR | Status: DC
  Administered 2018-01-16: 20000 [IU] via SUBCUTANEOUS

## 2018-01-24 ENCOUNTER — Ambulatory Visit (HOSPITAL_COMMUNITY): Payer: Medicare Other

## 2018-01-24 ENCOUNTER — Other Ambulatory Visit (HOSPITAL_COMMUNITY): Payer: Medicare Other

## 2018-01-31 ENCOUNTER — Encounter (HOSPITAL_COMMUNITY): Payer: Self-pay

## 2018-01-31 ENCOUNTER — Encounter (HOSPITAL_COMMUNITY)
Admission: RE | Admit: 2018-01-31 | Discharge: 2018-01-31 | Disposition: A | Discharge status: Home / Self Care | Payer: Medicare Other | Source: Ambulatory Visit | Attending: Nephrology | Admitting: Nephrology

## 2018-01-31 DIAGNOSIS — N184 Chronic kidney disease, stage 4 (severe): Secondary | ICD-10-CM | POA: Diagnosis not present

## 2018-01-31 LAB — POCT HEMOGLOBIN-HEMACUE: Hemoglobin: 11.6 g/dL — ABNORMAL LOW (ref 12.0–15.0)

## 2018-01-31 MED ORDER — EPOETIN ALFA 20000 UNIT/ML IJ SOLN
INTRAMUSCULAR | Status: AC
  Filled 2018-01-31: qty 1

## 2018-01-31 MED ORDER — EPOETIN ALFA 20000 UNIT/ML IJ SOLN
20000.0000 [IU] | INTRAMUSCULAR | Status: DC
  Administered 2018-01-31: 20000 [IU] via SUBCUTANEOUS

## 2018-02-14 ENCOUNTER — Encounter (HOSPITAL_COMMUNITY): Payer: Self-pay

## 2018-02-14 ENCOUNTER — Encounter (HOSPITAL_COMMUNITY)
Admission: RE | Admit: 2018-02-14 | Discharge: 2018-02-14 | Disposition: A | Discharge status: Home / Self Care | Payer: Medicare Other | Source: Ambulatory Visit | Attending: Nephrology | Admitting: Nephrology

## 2018-02-14 DIAGNOSIS — D631 Anemia in chronic kidney disease: Secondary | ICD-10-CM | POA: Insufficient documentation

## 2018-02-14 DIAGNOSIS — N184 Chronic kidney disease, stage 4 (severe): Secondary | ICD-10-CM | POA: Insufficient documentation

## 2018-02-14 LAB — POCT HEMOGLOBIN-HEMACUE: Hemoglobin: 11.1 g/dL — ABNORMAL LOW (ref 12.0–15.0)

## 2018-02-14 MED ORDER — EPOETIN ALFA 20000 UNIT/ML IJ SOLN
20000.0000 [IU] | Freq: Once | INTRAMUSCULAR | Status: AC
  Administered 2018-02-14: 20000 [IU] via SUBCUTANEOUS

## 2018-02-14 MED ORDER — EPOETIN ALFA 20000 UNIT/ML IJ SOLN
INTRAMUSCULAR | Status: AC
  Filled 2018-02-14: qty 1

## 2018-02-28 ENCOUNTER — Encounter (HOSPITAL_COMMUNITY)
Admission: RE | Admit: 2018-02-28 | Discharge: 2018-02-28 | Disposition: A | Discharge status: Home / Self Care | Payer: Medicare Other | Source: Ambulatory Visit | Attending: Nephrology | Admitting: Nephrology

## 2018-02-28 DIAGNOSIS — N184 Chronic kidney disease, stage 4 (severe): Secondary | ICD-10-CM | POA: Diagnosis not present

## 2018-02-28 LAB — IRON AND TIBC
Iron: 73 ug/dL (ref 28–170)
Saturation Ratios: 29 % (ref 10.4–31.8)
TIBC: 254 ug/dL (ref 250–450)
UIBC: 181 ug/dL

## 2018-02-28 LAB — RENAL FUNCTION PANEL
Albumin: 3.5 g/dL (ref 3.5–5.0)
Anion gap: 12 (ref 5–15)
BUN: 102 mg/dL — ABNORMAL HIGH (ref 8–23)
CO2: 18 mmol/L — ABNORMAL LOW (ref 22–32)
CREATININE: 3.82 mg/dL — AB (ref 0.44–1.00)
Calcium: 9.5 mg/dL (ref 8.9–10.3)
Chloride: 113 mmol/L — ABNORMAL HIGH (ref 98–111)
GFR calc non Af Amer: 10 mL/min — ABNORMAL LOW (ref >60)
GFR, EST AFRICAN AMERICAN: 12 mL/min — AB (ref >60)
Glucose, Bld: 190 mg/dL — ABNORMAL HIGH (ref 70–99)
Phosphorus: 3.7 mg/dL (ref 2.5–4.6)
Potassium: 4.1 mmol/L (ref 3.5–5.1)
Sodium: 143 mmol/L (ref 135–145)

## 2018-02-28 LAB — POCT HEMOGLOBIN-HEMACUE: Hemoglobin: 11.6 g/dL — ABNORMAL LOW (ref 12.0–15.0)

## 2018-02-28 LAB — FERRITIN: Ferritin: 614 ng/mL — ABNORMAL HIGH (ref 11–307)

## 2018-02-28 MED ORDER — EPOETIN ALFA 20000 UNIT/ML IJ SOLN
INTRAMUSCULAR | Status: AC
  Filled 2018-02-28: qty 1

## 2018-02-28 MED ORDER — EPOETIN ALFA 20000 UNIT/ML IJ SOLN
20000.0000 [IU] | Freq: Once | INTRAMUSCULAR | Status: AC
  Administered 2018-02-28: 20000 [IU] via SUBCUTANEOUS

## 2018-03-14 ENCOUNTER — Encounter (HOSPITAL_COMMUNITY)
Admission: RE | Admit: 2018-03-14 | Discharge: 2018-03-14 | Disposition: A | Discharge status: Home / Self Care | Payer: Medicare Other | Source: Ambulatory Visit | Attending: Nephrology | Admitting: Nephrology

## 2018-03-14 DIAGNOSIS — D631 Anemia in chronic kidney disease: Secondary | ICD-10-CM | POA: Diagnosis not present

## 2018-03-14 DIAGNOSIS — N184 Chronic kidney disease, stage 4 (severe): Secondary | ICD-10-CM | POA: Insufficient documentation

## 2018-03-14 LAB — POCT HEMOGLOBIN-HEMACUE: Hemoglobin: 11.7 g/dL — ABNORMAL LOW (ref 12.0–15.0)

## 2018-03-14 MED ORDER — EPOETIN ALFA 20000 UNIT/ML IJ SOLN
INTRAMUSCULAR | Status: AC
  Filled 2018-03-14: qty 1

## 2018-03-14 MED ORDER — EPOETIN ALFA 20000 UNIT/ML IJ SOLN
20000.0000 [IU] | Freq: Once | INTRAMUSCULAR | Status: AC
  Administered 2018-03-14: 20000 [IU] via SUBCUTANEOUS

## 2018-03-25 ENCOUNTER — Inpatient Hospital Stay (HOSPITAL_COMMUNITY): Payer: Medicare Other

## 2018-03-25 ENCOUNTER — Inpatient Hospital Stay (HOSPITAL_COMMUNITY)
Admission: EM | Admit: 2018-03-25 | Discharge: 2018-04-12 | DRG: 064 | Disposition: E | Payer: Medicare Other | Attending: Pulmonary Disease | Admitting: Pulmonary Disease

## 2018-03-25 ENCOUNTER — Emergency Department (HOSPITAL_COMMUNITY): Payer: Medicare Other

## 2018-03-25 ENCOUNTER — Other Ambulatory Visit: Payer: Self-pay

## 2018-03-25 ENCOUNTER — Encounter (HOSPITAL_COMMUNITY): Payer: Self-pay | Admitting: Emergency Medicine

## 2018-03-25 DIAGNOSIS — E440 Moderate protein-calorie malnutrition: Secondary | ICD-10-CM | POA: Diagnosis present

## 2018-03-25 DIAGNOSIS — R471 Dysarthria and anarthria: Secondary | ICD-10-CM | POA: Diagnosis present

## 2018-03-25 DIAGNOSIS — Z794 Long term (current) use of insulin: Secondary | ICD-10-CM

## 2018-03-25 DIAGNOSIS — E875 Hyperkalemia: Secondary | ICD-10-CM | POA: Diagnosis present

## 2018-03-25 DIAGNOSIS — I610 Nontraumatic intracerebral hemorrhage in hemisphere, subcortical: Secondary | ICD-10-CM

## 2018-03-25 DIAGNOSIS — Z90710 Acquired absence of both cervix and uterus: Secondary | ICD-10-CM

## 2018-03-25 DIAGNOSIS — G8191 Hemiplegia, unspecified affecting right dominant side: Secondary | ICD-10-CM | POA: Diagnosis present

## 2018-03-25 DIAGNOSIS — Z79899 Other long term (current) drug therapy: Secondary | ICD-10-CM

## 2018-03-25 DIAGNOSIS — N185 Chronic kidney disease, stage 5: Secondary | ICD-10-CM | POA: Diagnosis present

## 2018-03-25 DIAGNOSIS — R29722 NIHSS score 22: Secondary | ICD-10-CM | POA: Diagnosis not present

## 2018-03-25 DIAGNOSIS — Z6824 Body mass index (BMI) 24.0-24.9, adult: Secondary | ICD-10-CM

## 2018-03-25 DIAGNOSIS — M1990 Unspecified osteoarthritis, unspecified site: Secondary | ICD-10-CM | POA: Diagnosis present

## 2018-03-25 DIAGNOSIS — R402323 Coma scale, best motor response, extension, at hospital admission: Secondary | ICD-10-CM | POA: Diagnosis present

## 2018-03-25 DIAGNOSIS — J45909 Unspecified asthma, uncomplicated: Secondary | ICD-10-CM | POA: Diagnosis present

## 2018-03-25 DIAGNOSIS — I63512 Cerebral infarction due to unspecified occlusion or stenosis of left middle cerebral artery: Principal | ICD-10-CM | POA: Diagnosis present

## 2018-03-25 DIAGNOSIS — R402213 Coma scale, best verbal response, none, at hospital admission: Secondary | ICD-10-CM | POA: Diagnosis present

## 2018-03-25 DIAGNOSIS — R29720 NIHSS score 20: Secondary | ICD-10-CM | POA: Diagnosis present

## 2018-03-25 DIAGNOSIS — D696 Thrombocytopenia, unspecified: Secondary | ICD-10-CM | POA: Diagnosis not present

## 2018-03-25 DIAGNOSIS — R4182 Altered mental status, unspecified: Secondary | ICD-10-CM

## 2018-03-25 DIAGNOSIS — Z66 Do not resuscitate: Secondary | ICD-10-CM | POA: Diagnosis not present

## 2018-03-25 DIAGNOSIS — M6282 Rhabdomyolysis: Secondary | ICD-10-CM | POA: Diagnosis present

## 2018-03-25 DIAGNOSIS — R402113 Coma scale, eyes open, never, at hospital admission: Secondary | ICD-10-CM | POA: Diagnosis present

## 2018-03-25 DIAGNOSIS — R29738 NIHSS score 38: Secondary | ICD-10-CM | POA: Diagnosis not present

## 2018-03-25 DIAGNOSIS — Z8249 Family history of ischemic heart disease and other diseases of the circulatory system: Secondary | ICD-10-CM

## 2018-03-25 DIAGNOSIS — I120 Hypertensive chronic kidney disease with stage 5 chronic kidney disease or end stage renal disease: Secondary | ICD-10-CM | POA: Diagnosis not present

## 2018-03-25 DIAGNOSIS — E1122 Type 2 diabetes mellitus with diabetic chronic kidney disease: Secondary | ICD-10-CM | POA: Diagnosis not present

## 2018-03-25 DIAGNOSIS — I2510 Atherosclerotic heart disease of native coronary artery without angina pectoris: Secondary | ICD-10-CM | POA: Diagnosis present

## 2018-03-25 DIAGNOSIS — J9601 Acute respiratory failure with hypoxia: Secondary | ICD-10-CM | POA: Diagnosis present

## 2018-03-25 DIAGNOSIS — I611 Nontraumatic intracerebral hemorrhage in hemisphere, cortical: Secondary | ICD-10-CM

## 2018-03-25 DIAGNOSIS — E872 Acidosis: Secondary | ICD-10-CM | POA: Diagnosis not present

## 2018-03-25 DIAGNOSIS — R4701 Aphasia: Secondary | ICD-10-CM | POA: Diagnosis present

## 2018-03-25 DIAGNOSIS — R29731 NIHSS score 31: Secondary | ICD-10-CM | POA: Diagnosis not present

## 2018-03-25 DIAGNOSIS — Z955 Presence of coronary angioplasty implant and graft: Secondary | ICD-10-CM

## 2018-03-25 DIAGNOSIS — I614 Nontraumatic intracerebral hemorrhage in cerebellum: Secondary | ICD-10-CM

## 2018-03-25 DIAGNOSIS — G935 Compression of brain: Secondary | ICD-10-CM | POA: Diagnosis present

## 2018-03-25 DIAGNOSIS — I609 Nontraumatic subarachnoid hemorrhage, unspecified: Secondary | ICD-10-CM | POA: Diagnosis present

## 2018-03-25 DIAGNOSIS — E878 Other disorders of electrolyte and fluid balance, not elsewhere classified: Secondary | ICD-10-CM | POA: Diagnosis present

## 2018-03-25 DIAGNOSIS — G936 Cerebral edema: Secondary | ICD-10-CM | POA: Diagnosis present

## 2018-03-25 DIAGNOSIS — Z7982 Long term (current) use of aspirin: Secondary | ICD-10-CM

## 2018-03-25 DIAGNOSIS — E785 Hyperlipidemia, unspecified: Secondary | ICD-10-CM | POA: Diagnosis present

## 2018-03-25 DIAGNOSIS — J9600 Acute respiratory failure, unspecified whether with hypoxia or hypercapnia: Secondary | ICD-10-CM

## 2018-03-25 DIAGNOSIS — I619 Nontraumatic intracerebral hemorrhage, unspecified: Secondary | ICD-10-CM | POA: Diagnosis present

## 2018-03-25 DIAGNOSIS — Z833 Family history of diabetes mellitus: Secondary | ICD-10-CM

## 2018-03-25 DIAGNOSIS — E1165 Type 2 diabetes mellitus with hyperglycemia: Secondary | ICD-10-CM | POA: Diagnosis present

## 2018-03-25 DIAGNOSIS — E870 Hyperosmolality and hypernatremia: Secondary | ICD-10-CM | POA: Diagnosis present

## 2018-03-25 DIAGNOSIS — I160 Hypertensive urgency: Secondary | ICD-10-CM | POA: Diagnosis present

## 2018-03-25 DIAGNOSIS — R740 Nonspecific elevation of levels of transaminase and lactic acid dehydrogenase [LDH]: Secondary | ICD-10-CM | POA: Diagnosis present

## 2018-03-25 DIAGNOSIS — E669 Obesity, unspecified: Secondary | ICD-10-CM | POA: Diagnosis present

## 2018-03-25 LAB — DIFFERENTIAL
Abs Immature Granulocytes: 0.04 10*3/uL (ref 0.00–0.07)
BASOS PCT: 0 %
Basophils Absolute: 0 10*3/uL (ref 0.0–0.1)
Eosinophils Absolute: 0 10*3/uL (ref 0.0–0.5)
Eosinophils Relative: 0 %
IMMATURE GRANULOCYTES: 0 %
Lymphocytes Relative: 9 %
Lymphs Abs: 0.8 10*3/uL (ref 0.7–4.0)
Monocytes Absolute: 0.5 10*3/uL (ref 0.1–1.0)
Monocytes Relative: 6 %
NEUTROS PCT: 85 %
Neutro Abs: 7.8 10*3/uL — ABNORMAL HIGH (ref 1.7–7.7)

## 2018-03-25 LAB — GLUCOSE, CAPILLARY
GLUCOSE-CAPILLARY: 328 mg/dL — AB (ref 70–99)
Glucose-Capillary: 319 mg/dL — ABNORMAL HIGH (ref 70–99)
Glucose-Capillary: 314 mg/dL — ABNORMAL HIGH (ref 70–99)
Glucose-Capillary: 267 mg/dL — ABNORMAL HIGH (ref 70–99)
Glucose-Capillary: 244 mg/dL — ABNORMAL HIGH (ref 70–99)
Glucose-Capillary: 161 mg/dL — ABNORMAL HIGH (ref 70–99)

## 2018-03-25 LAB — POCT I-STAT 7, (LYTES, BLD GAS, ICA,H+H)
Acid-base deficit: 9 mmol/L — ABNORMAL HIGH (ref 0.0–2.0)
Bicarbonate: 18.2 mmol/L — ABNORMAL LOW (ref 20.0–28.0)
Calcium, Ion: 1.32 mmol/L (ref 1.15–1.40)
HCT: 40 % (ref 36.0–46.0)
Hemoglobin: 13.6 g/dL (ref 12.0–15.0)
O2 Saturation: 100 %
PCO2 ART: 43.1 mmHg (ref 32.0–48.0)
PH ART: 7.232 — AB (ref 7.350–7.450)
Potassium: 4.9 mmol/L (ref 3.5–5.1)
Sodium: 147 mmol/L — ABNORMAL HIGH (ref 135–145)
TCO2: 19 mmol/L — ABNORMAL LOW (ref 22–32)
pO2, Arterial: 287 mmHg — ABNORMAL HIGH (ref 83.0–108.0)

## 2018-03-25 LAB — CBC
HCT: 53.1 % — ABNORMAL HIGH (ref 36.0–46.0)
HCT: 48.8 % — ABNORMAL HIGH (ref 36.0–46.0)
HEMOGLOBIN: 15.5 g/dL — AB (ref 12.0–15.0)
Hemoglobin: 14.1 g/dL (ref 12.0–15.0)
MCH: 29.4 pg (ref 26.0–34.0)
MCH: 29 pg (ref 26.0–34.0)
MCHC: 29.2 g/dL — ABNORMAL LOW (ref 30.0–36.0)
MCHC: 28.9 g/dL — ABNORMAL LOW (ref 30.0–36.0)
MCV: 100.6 fL — ABNORMAL HIGH (ref 80.0–100.0)
MCV: 100.4 fL — ABNORMAL HIGH (ref 80.0–100.0)
NRBC: 0.3 % — AB (ref 0.0–0.2)
Platelets: 91 10*3/uL — ABNORMAL LOW (ref 150–400)
Platelets: 105 10*3/uL — ABNORMAL LOW (ref 150–400)
RBC: 5.28 MIL/uL — ABNORMAL HIGH (ref 3.87–5.11)
RBC: 4.86 MIL/uL (ref 3.87–5.11)
RDW: 16.6 % — ABNORMAL HIGH (ref 11.5–15.5)
RDW: 16.3 % — AB (ref 11.5–15.5)
WBC: 9.2 10*3/uL (ref 4.0–10.5)
WBC: 9.3 10*3/uL (ref 4.0–10.5)
nRBC: 0.3 % — ABNORMAL HIGH (ref 0.0–0.2)

## 2018-03-25 LAB — RESPIRATORY PANEL BY PCR
Adenovirus: NOT DETECTED
Bordetella pertussis: NOT DETECTED
CORONAVIRUS 229E-RVPPCR: NOT DETECTED
Chlamydophila pneumoniae: NOT DETECTED
Coronavirus HKU1: NOT DETECTED
Coronavirus NL63: NOT DETECTED
Coronavirus OC43: NOT DETECTED
Influenza A: NOT DETECTED
Influenza B: NOT DETECTED
MYCOPLASMA PNEUMONIAE-RVPPCR: NOT DETECTED
Metapneumovirus: NOT DETECTED
PARAINFLUENZA VIRUS 1-RVPPCR: NOT DETECTED
Parainfluenza Virus 2: NOT DETECTED
Parainfluenza Virus 3: NOT DETECTED
Parainfluenza Virus 4: NOT DETECTED
Respiratory Syncytial Virus: NOT DETECTED
Rhinovirus / Enterovirus: NOT DETECTED

## 2018-03-25 LAB — URINALYSIS, ROUTINE W REFLEX MICROSCOPIC
Bilirubin Urine: NEGATIVE
Glucose, UA: >=500 mg/dL — AB
Ketones, ur: NEGATIVE mg/dL
Leukocytes,Ua: NEGATIVE
NITRITE: NEGATIVE
Protein, ur: >=300 mg/dL — AB
Specific Gravity, Urine: 1.015 (ref 1.005–1.030)
pH: 5 (ref 5.0–8.0)

## 2018-03-25 LAB — COMPREHENSIVE METABOLIC PANEL
ALT: 54 U/L — ABNORMAL HIGH (ref 0–44)
ALT: 49 U/L — ABNORMAL HIGH (ref 0–44)
AST: 50 U/L — ABNORMAL HIGH (ref 15–41)
AST: 63 U/L — ABNORMAL HIGH (ref 15–41)
Albumin: 3.7 g/dL (ref 3.5–5.0)
Albumin: 3.3 g/dL — ABNORMAL LOW (ref 3.5–5.0)
Alkaline Phosphatase: 170 U/L — ABNORMAL HIGH (ref 38–126)
Alkaline Phosphatase: 143 U/L — ABNORMAL HIGH (ref 38–126)
Anion gap: 15 (ref 5–15)
Anion gap: 16 — ABNORMAL HIGH (ref 5–15)
BUN: 91 mg/dL — ABNORMAL HIGH (ref 8–23)
BUN: 118 mg/dL — ABNORMAL HIGH (ref 8–23)
CO2: 16 mmol/L — AB (ref 22–32)
CO2: 16 mmol/L — ABNORMAL LOW (ref 22–32)
Calcium: 9.9 mg/dL (ref 8.9–10.3)
Calcium: 10.1 mg/dL (ref 8.9–10.3)
Chloride: 119 mmol/L — ABNORMAL HIGH (ref 98–111)
Chloride: 117 mmol/L — ABNORMAL HIGH (ref 98–111)
Creatinine, Ser: 4.83 mg/dL — ABNORMAL HIGH (ref 0.44–1.00)
Creatinine, Ser: 5.21 mg/dL — ABNORMAL HIGH (ref 0.44–1.00)
GFR calc Af Amer: 9 mL/min — ABNORMAL LOW (ref >60)
GFR calc non Af Amer: 8 mL/min — ABNORMAL LOW (ref >60)
GLUCOSE: 368 mg/dL — AB (ref 70–99)
Glucose, Bld: 404 mg/dL — ABNORMAL HIGH (ref 70–99)
Potassium: 5.6 mmol/L — ABNORMAL HIGH (ref 3.5–5.1)
Potassium: 5.7 mmol/L — ABNORMAL HIGH (ref 3.5–5.1)
SODIUM: 149 mmol/L — AB (ref 135–145)
SODIUM: 150 mmol/L — AB (ref 135–145)
Total Bilirubin: 1.6 mg/dL — ABNORMAL HIGH (ref 0.3–1.2)
Total Bilirubin: 1.6 mg/dL — ABNORMAL HIGH (ref 0.3–1.2)
Total Protein: 7.1 g/dL (ref 6.5–8.1)
Total Protein: 6.1 g/dL — ABNORMAL LOW (ref 6.5–8.1)

## 2018-03-25 LAB — I-STAT CREATININE, ED: Creatinine, Ser: 5.1 mg/dL — ABNORMAL HIGH (ref 0.44–1.00)

## 2018-03-25 LAB — MRSA PCR SCREENING: MRSA BY PCR: NEGATIVE

## 2018-03-25 LAB — RAPID URINE DRUG SCREEN, HOSP PERFORMED
AMPHETAMINES: NOT DETECTED
Barbiturates: NOT DETECTED
Benzodiazepines: NOT DETECTED
Cocaine: NOT DETECTED
Opiates: NOT DETECTED
Tetrahydrocannabinol: NOT DETECTED

## 2018-03-25 LAB — MAGNESIUM: Magnesium: 2.3 mg/dL (ref 1.7–2.4)

## 2018-03-25 LAB — ECHOCARDIOGRAM COMPLETE
Height: 64 in
Weight: 2331.58 oz

## 2018-03-25 LAB — APTT: aPTT: 27 seconds (ref 24–36)

## 2018-03-25 LAB — PROTIME-INR
INR: 1.2 (ref 0.8–1.2)
Prothrombin Time: 15.5 seconds — ABNORMAL HIGH (ref 11.4–15.2)

## 2018-03-25 LAB — ETHANOL: Alcohol, Ethyl (B): <10 mg/dL (ref <10)

## 2018-03-25 LAB — CK
Total CK: 1480 U/L — ABNORMAL HIGH (ref 38–234)
Total CK: 2092 U/L — ABNORMAL HIGH (ref 38–234)

## 2018-03-25 MED ORDER — STROKE: EARLY STAGES OF RECOVERY BOOK: Freq: Once | Status: DC

## 2018-03-25 MED ORDER — FENTANYL BOLUS VIA INFUSION
25.0000 ug | INTRAVENOUS | Status: DC | PRN
  Filled 2018-03-25: qty 25

## 2018-03-25 MED ORDER — SODIUM CHLORIDE 0.9 % IV SOLN
500.0000 mg | Freq: Every day | INTRAVENOUS | Status: DC
  Administered 2018-03-25: 500 mg via INTRAVENOUS
  Filled 2018-03-25: qty 500

## 2018-03-25 MED ORDER — SODIUM CHLORIDE 0.9 % IV SOLN
INTRAVENOUS | Status: DC | PRN
  Administered 2018-03-25: 500 mL via INTRAVENOUS

## 2018-03-25 MED ORDER — PROPOFOL 1000 MG/100ML IV EMUL
5.0000 ug/kg/min | INTRAVENOUS | Status: DC
  Administered 2018-03-25: 10 ug/kg/min via INTRAVENOUS
  Filled 2018-03-25: qty 100

## 2018-03-25 MED ORDER — LABETALOL HCL 5 MG/ML IV SOLN
5.0000 mg | Freq: Once | INTRAVENOUS | Status: AC
  Administered 2018-03-25: 5 mg via INTRAVENOUS

## 2018-03-25 MED ORDER — CHLORHEXIDINE GLUCONATE 0.12% ORAL RINSE (MEDLINE KIT)
15.0000 mL | Freq: Two times a day (BID) | OROMUCOSAL | Status: DC
  Administered 2018-03-25: 15 mL via OROMUCOSAL

## 2018-03-25 MED ORDER — FENTANYL CITRATE (PF) 100 MCG/2ML IJ SOLN: 50.0000 ug | Freq: Once | INTRAMUSCULAR | Status: DC

## 2018-03-25 MED ORDER — ACETAMINOPHEN 325 MG PO TABS: 650.0000 mg | ORAL_TABLET | ORAL | Status: DC | PRN

## 2018-03-25 MED ORDER — IOHEXOL 350 MG/ML SOLN
75.0000 mL | Freq: Once | INTRAVENOUS | Status: AC | PRN
  Administered 2018-03-25: 75 mL via INTRAVENOUS

## 2018-03-25 MED ORDER — SENNOSIDES 8.8 MG/5ML PO SYRP: 5.0000 mL | ORAL_SOLUTION | Freq: Two times a day (BID) | ORAL | Status: DC | PRN

## 2018-03-25 MED ORDER — ETOMIDATE 2 MG/ML IV SOLN
INTRAVENOUS | Status: AC | PRN
  Administered 2018-03-25: 20 mg via INTRAVENOUS

## 2018-03-25 MED ORDER — CLEVIDIPINE BUTYRATE 0.5 MG/ML IV EMUL
0.0000 mg/h | INTRAVENOUS | Status: DC
  Administered 2018-03-25: 4 mg/h via INTRAVENOUS
  Administered 2018-03-25: 1 mg/h via INTRAVENOUS
  Filled 2018-03-25 (×2): qty 50

## 2018-03-25 MED ORDER — SENNOSIDES-DOCUSATE SODIUM 8.6-50 MG PO TABS
1.0000 | ORAL_TABLET | Freq: Two times a day (BID) | ORAL | Status: DC
  Filled 2018-03-25: qty 1

## 2018-03-25 MED ORDER — SODIUM CHLORIDE 0.9 % IV SOLN
1.0000 g | Freq: Once | INTRAVENOUS | Status: DC
  Filled 2018-03-25: qty 10

## 2018-03-25 MED ORDER — CLEVIDIPINE BUTYRATE 0.5 MG/ML IV EMUL
0.0000 mg/h | INTRAVENOUS | Status: DC
  Administered 2018-03-25: 3 mg/h via INTRAVENOUS
  Filled 2018-03-25: qty 50

## 2018-03-25 MED ORDER — LABETALOL HCL 5 MG/ML IV SOLN
INTRAVENOUS | Status: AC
  Administered 2018-03-25: 5 mg via INTRAVENOUS
  Filled 2018-03-25: qty 4

## 2018-03-25 MED ORDER — ALBUTEROL SULFATE (2.5 MG/3ML) 0.083% IN NEBU: 2.5000 mg | INHALATION_SOLUTION | RESPIRATORY_TRACT | Status: DC | PRN

## 2018-03-25 MED ORDER — CLEVIDIPINE BUTYRATE 0.5 MG/ML IV EMUL
0.0000 mg/h | INTRAVENOUS | Status: DC
  Filled 2018-03-25 (×2): qty 50

## 2018-03-25 MED ORDER — CALCIUM GLUCONATE-NACL 1-0.675 GM/50ML-% IV SOLN
INTRAVENOUS | Status: AC
  Administered 2018-03-25: 1000 mg
  Filled 2018-03-25: qty 50

## 2018-03-25 MED ORDER — PANTOPRAZOLE SODIUM 40 MG IV SOLR: 40.0000 mg | Freq: Every day | INTRAVENOUS | Status: DC

## 2018-03-25 MED ORDER — SODIUM CHLORIDE 0.9 % IV SOLN
1.0000 g | Freq: Every day | INTRAVENOUS | Status: DC
  Administered 2018-03-25: 1 g via INTRAVENOUS
  Filled 2018-03-25: qty 10

## 2018-03-25 MED ORDER — INSULIN ASPART 100 UNIT/ML ~~LOC~~ SOLN: 1.0000 [IU] | SUBCUTANEOUS | Status: DC

## 2018-03-25 MED ORDER — ACETAMINOPHEN 650 MG RE SUPP: 650.0000 mg | RECTAL | Status: DC | PRN

## 2018-03-25 MED ORDER — INSULIN REGULAR(HUMAN) IN NACL 100-0.9 UT/100ML-% IV SOLN
INTRAVENOUS | Status: DC
  Administered 2018-03-25: 2.5 [IU]/h via INTRAVENOUS
  Filled 2018-03-25: qty 100

## 2018-03-25 MED ORDER — MANNITOL 25 % IV SOLN
INTRAVENOUS | Status: AC
  Administered 2018-03-25: 25 g
  Filled 2018-03-25: qty 100

## 2018-03-25 MED ORDER — FENTANYL 2500MCG IN NS 250ML (10MCG/ML) PREMIX INFUSION: 25.0000 ug/h | INTRAVENOUS | Status: DC

## 2018-03-25 MED ORDER — MANNITOL 25 % IV SOLN
25.0000 g | Freq: Once | Status: AC
  Filled 2018-03-25: qty 100

## 2018-03-25 MED ORDER — SODIUM CHLORIDE 0.9 % IV SOLN: INTRAVENOUS | Status: DC | PRN

## 2018-03-25 MED ORDER — SODIUM CHLORIDE 0.9 % IV BOLUS
500.0000 mL | Freq: Once | INTRAVENOUS | Status: AC
  Administered 2018-03-25: 500 mL via INTRAVENOUS

## 2018-03-25 MED ORDER — INSULIN GLARGINE 100 UNIT/ML ~~LOC~~ SOLN
10.0000 [IU] | Freq: Every day | SUBCUTANEOUS | Status: DC
  Administered 2018-03-25: 10 [IU] via SUBCUTANEOUS
  Filled 2018-03-25: qty 0.1

## 2018-03-25 MED ORDER — ROCURONIUM BROMIDE 50 MG/5ML IV SOLN
INTRAVENOUS | Status: AC | PRN
  Administered 2018-03-25: 80 mg via INTRAVENOUS

## 2018-03-25 MED ORDER — SODIUM CHLORIDE 0.9 % IV SOLN: INTRAVENOUS | Status: DC

## 2018-03-25 MED ORDER — ORAL CARE MOUTH RINSE
15.0000 mL | OROMUCOSAL | Status: DC
  Administered 2018-03-25 (×3): 15 mL via OROMUCOSAL

## 2018-03-25 MED ORDER — ACETAMINOPHEN 160 MG/5ML PO SOLN: 650.0000 mg | ORAL | Status: DC | PRN

## 2018-03-28 ENCOUNTER — Encounter (HOSPITAL_COMMUNITY): Admission: RE | Admit: 2018-03-28 | Payer: Medicare Other | Source: Ambulatory Visit

## 2018-03-28 ENCOUNTER — Encounter (HOSPITAL_COMMUNITY): Payer: Medicare Other

## 2018-03-30 LAB — CULTURE, BLOOD (ROUTINE X 2)
Culture: NO GROWTH
Culture: NO GROWTH
Special Requests: ADEQUATE
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
## 2018-04-12 NOTE — Progress Notes (Signed)
PT Cancellation Note  Patient Details Name: Donna Ochoa MRN: [id number] DOB: 01/01/1934   Cancelled Treatment:    Reason Eval/Treat Not Completed: Patient not medically ready(pt on vent and bedrest at this time)   Cordelro Gautreau B Sabrinia Prien April 05, 2018, 6:58 AM Elwyn Reach, PT Acute Rehabilitation Services Pager: [phone] Office: [phone]

## 2018-04-12 NOTE — Progress Notes (Signed)
Patient deceased. RT pulled ETT tube.

## 2018-04-12 NOTE — Death Summary Note (Signed)
DEATH SUMMARY   Patient Details  Name: Donna Ochoa MRN: [id number] DOB: August 21, 1933  Admission/Discharge Information   Admit Date:  April 23, 2018  Date of Death: Date of Death: April 23, 2018  Time of Death: Time of Death: 65  Length of Stay: 1  Referring Physician: Glenda Chroman, MD   Reason(s) for Hospitalization  Cerebrovascular accident  Diagnoses  Preliminary cause of death: Cerebrovascular accident secondary to [place] Secondary Diagnoses (including complications and co-morbidities):  Active Problems:   ICH (intracerebral hemorrhage) (HCC)   Cytotoxic brain edema (HCC)   Brain herniation (HCC)   Malnutrition of moderate degree   Brief Hospital Course (including significant findings, care, treatment, and services provided and events leading to death)  Donna Ochoa is a 85 y.o. year old female who presented to hospital with altered mental status.  Apparently she had been declining for several days.  She was found to have had a large intraparenchymal bleed superimposed upon an ischemic stroke.  She began to develop cerebral edema and became progressively less responsive.  Was not deemed a candidate for decompressive craniectomy even her age and extent of her deficits.  Both neurology, neurosurgery and critical care were of the opinion that this was not a survivable event.  This was presented to the family who acknowledged the patient would not wish to live dependent on others and the agreed to to not further escalate care.  Patient further declined and passed away peacefully.    Pertinent Labs and Studies  Significant Diagnostic Studies Ct Angio Head W Or Wo Contrast  Result Date: Apr 23, 2018 CLINICAL DATA:  Initial evaluation for acute intracranial hemorrhage. EXAM: CT ANGIOGRAPHY HEAD AND NECK TECHNIQUE: Multidetector CT imaging of the head and neck was performed using the standard protocol during bolus administration of intravenous contrast. Multiplanar CT image reconstructions  and MIPs were obtained to evaluate the vascular anatomy. Carotid stenosis measurements (when applicable) are obtained utilizing NASCET criteria, using the distal internal carotid diameter as the denominator. CONTRAST:  71mL OMNIPAQUE IOHEXOL 350 MG/ML SOLN COMPARISON:  Prior CT from earlier the same day. FINDINGS: CT HEAD FINDINGS Brain: Large confluent area of cytotoxic edema involving the left MCA territory again seen, stable. Superimposed intraparenchymal hemorrhage at the left frontal lobe and left basal ganglia also relatively unchanged. Constellation of findings favored to reflect acute MCA infarct with hemorrhagic transformation. Regional mass effect with up to 10 mm left-to-right shift relatively similar. No hydrocephalus or ventricular trapping. Basilar cisterns are crowded but remain patent. Overall, appearance is not significantly changed relative to prior CT from earlier same day. No other new findings. Vascular: No appreciable hyperdense vessel. Calcified atherosclerosis at the skull base. Skull: Scalp soft tissues and calvarium demonstrate no acute finding. Sinuses: Chronic pan sinusitis, stable. Nasal trumpet sent place, partially visualized. Mastoids are clear. Orbits: Globes and orbital soft tissues demonstrate no acute finding. Review of the MIP images confirms the above findings CTA NECK FINDINGS Aortic arch: Visualized aortic arch within normal limits for caliber with normal branch pattern. Moderate atherosclerotic change about the arch and origin of the great vessels without hemodynamically significant stenosis. Visualized subclavian arteries patent. Right carotid system: Right common carotid artery patent from its origin to the bifurcation without stenosis. Calcified plaque about the right bifurcation with associated stenosis of up to approximately 40-50% by NASCET criteria. Right ICA patent distally to the skull base without additional stenosis, dissection, or occlusion. Left carotid system:  Left common carotid artery patent from its origin to the bifurcation without flow-limiting  stenosis. Mixed plaque about the left bifurcation and proximal left ICA with mild stenosis of up to 30% by NASCET criteria. Left ICA widely patent distally to the skull base without additional stenosis, dissection, or occlusion. Vertebral arteries: Both of the vertebral arteries arise from the subclavian arteries. Right vertebral artery dominant. Vertebral arteries widely patent within the neck without stenosis, dissection or occlusion. Skeleton: No acute osseous finding. No discrete lytic or blastic osseous lesions. Mild degenerate spondylolysis within the cervical spine. Other neck: No other acute soft tissue abnormality within the neck. Two separate well-circumscribed mass lesion seen within the left parotid gland, measuring 1.8 cm and 2.0 cm respectively, indeterminate. She thyroid diffusely heterogeneous with scattered predominantly subcentimeter nodules, most likely related to goiter. Upper chest: Volume loss with irregular pleuroparenchymal thickening and scarring seen within the partially visualized right lung. Aerated left lung clear. No other acute finding within the upper chest. Review of the MIP images confirms the above findings CTA HEAD FINDINGS Anterior circulation: Petrous segments patent bilaterally. Scattered atheromatous plaque within the cavernous/supraclinoid ICAs with mild to moderate diffuse narrowing (no more than 50%). ICA termini well perfused. A1 segments patent bilaterally. Right A1 hypoplastic. Normal anterior communicating artery. Anterior cerebral arteries widely patent to their distal aspects. Right M1 widely patent. Normal right MCA bifurcation. Distal right MCA branches well perfused. Left M1 widely patent proximally. There is irregular filling with focal severe stenosis involving a proximal left M2 branch (series 10, image 202) with a second severe high-grade stenosis just distally (series  10, image 191). Findings favored to reflect partially recanalized and/or partially occlusive thrombus, although severe multifocal stenoses may be contributory. Perfusion is seen distally within the left MCA branches. No aneurysm identified. No other vascular abnormality seen underlying the left cerebral hemorrhage. Posterior circulation: Multifocal atheromatous plaque within the bilateral V4 segments with resultant mild to moderate multifocal stenoses. Posterior inferior cerebral arteries patent bilaterally. Basilar patent to its distal aspect without stenosis. Superior cerebral arteries patent bilaterally. Both of the posterior cerebral artery supplied via the basilar. Left PCA patent to its distal aspect without flow-limiting stenosis. Multifocal moderate to severe segmental stenoses present within the right P2 segment. Right PCA also patent to its distal aspect Venous sinuses: Grossly patent, although not well assessed due to timing of the contrast bolus. Anatomic variants: None significant. Delayed phase: No abnormal enhancement. Review of the MIP images confirms the above findings IMPRESSION: CT HEAD IMPRESSION 1. No significant interval change in large confluent area of evolving cytotoxic edema within the left MCA territory, compatible with evolving acute left MCA territory infarct. Associated hemorrhagic transformation with parenchymal hemorrhage involving the left frontal lobe and left basal ganglia also unchanged. 2. Associated regional mass effect with 10 mm left-to-right shift, relatively stable. No hydrocephalus or ventricular trapping at this time. CTA HEAD AND NECK IMPRESSION 1. Irregular filling with multifocal near occlusive stenoses involving a proximal left M2 branch as above. Finding felt to most likely reflect subocclusive and/or recanalized thrombus, although severe multifocal stenoses may be contributory as well. Perfusion is seen distally within the infarcted left MCA territory. No aneurysm or  other vascular abnormality seen underlying the left cerebral hemorrhage. 2. Atheromatous plaque about the carotid bifurcations with up to 40-50% stenosis on the right and 30% on the left. 3. Additional atherosclerotic change involving the intracranial circulation as above, with most notable findings including moderate to severe segmental right P2 stenoses, with moderate multifocal bilateral V4 stenoses. 4. Volume loss with irregular pleuroparenchymal scarring within the  right lung, partially visualized. 5. Two separate well-circumscribed left parotid masses measuring up to 2 cm, indeterminate. Nonemergent outpatient ENT referral and consultation could be performed for further workup and evaluation as clinically desired. Electronically Signed   By: Jeannine Boga M.D.   On: 04/12/18 06:39   Ct Head Wo Contrast  Result Date: 04-12-18 CLINICAL DATA:  Initial evaluation for acute neuro deficit. EXAM: CT HEAD WITHOUT CONTRAST TECHNIQUE: Contiguous axial images were obtained from the base of the skull through the vertex without intravenous contrast. COMPARISON:  Prior MRI from 09/17/2016. FINDINGS: Brain: Large confluent area of evolving cytotoxic edema throughout the left MCA territory, likely reflecting acute left MCA territory infarct. Superimposed parenchymal hemorrhage at the anterior left frontal lobe and left basal ganglia, favored to reflect hemorrhagic transformation. Associated scattered small volume subarachnoid hemorrhage. Regional mass effect with 9 mm left-to-right shift. No frank hydrocephalus or ventricular trapping at this time. Basilar cisterns are mildly crowded but remain patent. Underlying age-related cerebral atrophy. No other acute intracranial hemorrhage or large vessel territory infarct. No appreciable mass lesion. No extra-axial fluid collection. Vascular: No definite hyperdense vessel, although evaluation limited by adjacent hemorrhage. Calcified atherosclerosis at the skull base.  Skull: Scalp soft tissues and calvarium within normal limits. Sinuses/Orbits: Globes and orbital soft tissues demonstrate no acute finding. Right gaze noted. Chronic pansinusitis noted. Mastoids are clear. Other: None. IMPRESSION: 1. Large confluent area of evolving cytotoxic edema within the left MCA distribution, most consistent with acute left MCA territory infarct. Associated hemorrhagic transformation with parenchymal hemorrhage involving the left frontal lobe and left basal ganglia. A possible underlying aneurysmal bleed arising from the left MCA bifurcation with secondary infarct could conceivably considered as well, although is felt to be less likely. Further assessment with dedicated CTA and/or MRA recommended to evaluate for this possibility. 2. Associated regional mass effect with 9 mm left-to-right shift. No frank hydrocephalus or ventricular trapping at this time. Critical Value/emergent results were called by telephone at the time of interpretation on 04-12-2018 at 1:51 am to Dr. Merrily Pew , who verbally acknowledged these results. Electronically Signed   By: Jeannine Boga M.D.   On: 2018-04-12 02:00   Ct Angio Neck W Or Wo Contrast  Result Date: 12-Apr-2018 CLINICAL DATA:  Initial evaluation for acute intracranial hemorrhage. EXAM: CT ANGIOGRAPHY HEAD AND NECK TECHNIQUE: Multidetector CT imaging of the head and neck was performed using the standard protocol during bolus administration of intravenous contrast. Multiplanar CT image reconstructions and MIPs were obtained to evaluate the vascular anatomy. Carotid stenosis measurements (when applicable) are obtained utilizing NASCET criteria, using the distal internal carotid diameter as the denominator. CONTRAST:  35mL OMNIPAQUE IOHEXOL 350 MG/ML SOLN COMPARISON:  Prior CT from earlier the same day. FINDINGS: CT HEAD FINDINGS Brain: Large confluent area of cytotoxic edema involving the left MCA territory again seen, stable. Superimposed  intraparenchymal hemorrhage at the left frontal lobe and left basal ganglia also relatively unchanged. Constellation of findings favored to reflect acute MCA infarct with hemorrhagic transformation. Regional mass effect with up to 10 mm left-to-right shift relatively similar. No hydrocephalus or ventricular trapping. Basilar cisterns are crowded but remain patent. Overall, appearance is not significantly changed relative to prior CT from earlier same day. No other new findings. Vascular: No appreciable hyperdense vessel. Calcified atherosclerosis at the skull base. Skull: Scalp soft tissues and calvarium demonstrate no acute finding. Sinuses: Chronic pan sinusitis, stable. Nasal trumpet sent place, partially visualized. Mastoids are clear. Orbits: Globes and orbital soft tissues  demonstrate no acute finding. Review of the MIP images confirms the above findings CTA NECK FINDINGS Aortic arch: Visualized aortic arch within normal limits for caliber with normal branch pattern. Moderate atherosclerotic change about the arch and origin of the great vessels without hemodynamically significant stenosis. Visualized subclavian arteries patent. Right carotid system: Right common carotid artery patent from its origin to the bifurcation without stenosis. Calcified plaque about the right bifurcation with associated stenosis of up to approximately 40-50% by NASCET criteria. Right ICA patent distally to the skull base without additional stenosis, dissection, or occlusion. Left carotid system: Left common carotid artery patent from its origin to the bifurcation without flow-limiting stenosis. Mixed plaque about the left bifurcation and proximal left ICA with mild stenosis of up to 30% by NASCET criteria. Left ICA widely patent distally to the skull base without additional stenosis, dissection, or occlusion. Vertebral arteries: Both of the vertebral arteries arise from the subclavian arteries. Right vertebral artery dominant.  Vertebral arteries widely patent within the neck without stenosis, dissection or occlusion. Skeleton: No acute osseous finding. No discrete lytic or blastic osseous lesions. Mild degenerate spondylolysis within the cervical spine. Other neck: No other acute soft tissue abnormality within the neck. Two separate well-circumscribed mass lesion seen within the left parotid gland, measuring 1.8 cm and 2.0 cm respectively, indeterminate. She thyroid diffusely heterogeneous with scattered predominantly subcentimeter nodules, most likely related to goiter. Upper chest: Volume loss with irregular pleuroparenchymal thickening and scarring seen within the partially visualized right lung. Aerated left lung clear. No other acute finding within the upper chest. Review of the MIP images confirms the above findings CTA HEAD FINDINGS Anterior circulation: Petrous segments patent bilaterally. Scattered atheromatous plaque within the cavernous/supraclinoid ICAs with mild to moderate diffuse narrowing (no more than 50%). ICA termini well perfused. A1 segments patent bilaterally. Right A1 hypoplastic. Normal anterior communicating artery. Anterior cerebral arteries widely patent to their distal aspects. Right M1 widely patent. Normal right MCA bifurcation. Distal right MCA branches well perfused. Left M1 widely patent proximally. There is irregular filling with focal severe stenosis involving a proximal left M2 branch (series 10, image 202) with a second severe high-grade stenosis just distally (series 10, image 191). Findings favored to reflect partially recanalized and/or partially occlusive thrombus, although severe multifocal stenoses may be contributory. Perfusion is seen distally within the left MCA branches. No aneurysm identified. No other vascular abnormality seen underlying the left cerebral hemorrhage. Posterior circulation: Multifocal atheromatous plaque within the bilateral V4 segments with resultant mild to moderate  multifocal stenoses. Posterior inferior cerebral arteries patent bilaterally. Basilar patent to its distal aspect without stenosis. Superior cerebral arteries patent bilaterally. Both of the posterior cerebral artery supplied via the basilar. Left PCA patent to its distal aspect without flow-limiting stenosis. Multifocal moderate to severe segmental stenoses present within the right P2 segment. Right PCA also patent to its distal aspect Venous sinuses: Grossly patent, although not well assessed due to timing of the contrast bolus. Anatomic variants: None significant. Delayed phase: No abnormal enhancement. Review of the MIP images confirms the above findings IMPRESSION: CT HEAD IMPRESSION 1. No significant interval change in large confluent area of evolving cytotoxic edema within the left MCA territory, compatible with evolving acute left MCA territory infarct. Associated hemorrhagic transformation with parenchymal hemorrhage involving the left frontal lobe and left basal ganglia also unchanged. 2. Associated regional mass effect with 10 mm left-to-right shift, relatively stable. No hydrocephalus or ventricular trapping at this time. CTA HEAD AND NECK IMPRESSION 1.  Irregular filling with multifocal near occlusive stenoses involving a proximal left M2 branch as above. Finding felt to most likely reflect subocclusive and/or recanalized thrombus, although severe multifocal stenoses may be contributory as well. Perfusion is seen distally within the infarcted left MCA territory. No aneurysm or other vascular abnormality seen underlying the left cerebral hemorrhage. 2. Atheromatous plaque about the carotid bifurcations with up to 40-50% stenosis on the right and 30% on the left. 3. Additional atherosclerotic change involving the intracranial circulation as above, with most notable findings including moderate to severe segmental right P2 stenoses, with moderate multifocal bilateral V4 stenoses. 4. Volume loss with  irregular pleuroparenchymal scarring within the right lung, partially visualized. 5. Two separate well-circumscribed left parotid masses measuring up to 2 cm, indeterminate. Nonemergent outpatient ENT referral and consultation could be performed for further workup and evaluation as clinically desired. Electronically Signed   By: Jeannine Boga M.D.   On: Apr 13, 2018 06:39   Dg Chest Portable 1 View  Result Date: 04/13/2018 CLINICAL DATA:  ETT and OG tube placement EXAM: PORTABLE CHEST 1 VIEW COMPARISON:  12/31/2015 FINDINGS: Endotracheal tube terminates 3 cm above the carina. Enteric tube terminates in the proximal gastric body. Right lower lobe opacity, likely a combination of atelectasis and moderate right pleural effusion. Left lung is clear. No pneumothorax. Patient is rotated with rightward cardiomediastinal shift. Mild cardiomegaly. IMPRESSION: Endotracheal tube terminates 3 cm above the carina. Right lower lobe opacity, likely combination of atelectasis and moderate right pleural effusion. Enteric tube terminates in the proximal gastric body. Electronically Signed   By: Julian Hy M.D.   On: 04/13/2018 06:23    Microbiology Recent Results (from the past 240 hour(s))  Respiratory Panel by PCR     Status: None   Collection Time: 04-13-18  6:44 AM  Result Value Ref Range Status   Adenovirus NOT DETECTED NOT DETECTED Final   Coronavirus 229E NOT DETECTED NOT DETECTED Final    Comment: (NOTE) The Coronavirus on the Respiratory Panel, DOES NOT test for the novel  Coronavirus (2019 nCoV)    Coronavirus HKU1 NOT DETECTED NOT DETECTED Final   Coronavirus NL63 NOT DETECTED NOT DETECTED Final   Coronavirus OC43 NOT DETECTED NOT DETECTED Final   Metapneumovirus NOT DETECTED NOT DETECTED Final   Rhinovirus / Enterovirus NOT DETECTED NOT DETECTED Final   Influenza A NOT DETECTED NOT DETECTED Final   Influenza B NOT DETECTED NOT DETECTED Final   Parainfluenza Virus 1 NOT DETECTED NOT  DETECTED Final   Parainfluenza Virus 2 NOT DETECTED NOT DETECTED Final   Parainfluenza Virus 3 NOT DETECTED NOT DETECTED Final   Parainfluenza Virus 4 NOT DETECTED NOT DETECTED Final   Respiratory Syncytial Virus NOT DETECTED NOT DETECTED Final   Bordetella pertussis NOT DETECTED NOT DETECTED Final   Chlamydophila pneumoniae NOT DETECTED NOT DETECTED Final   Mycoplasma pneumoniae NOT DETECTED NOT DETECTED Final    Comment: Performed at [facility], [address], [place], [place] [address]  MRSA PCR Screening     Status: None   Collection Time: April 13, 2018  6:44 AM  Result Value Ref Range Status   MRSA by PCR NEGATIVE NEGATIVE Final    Comment:        The GeneXpert MRSA Assay (FDA approved for NASAL specimens only), is one component of a comprehensive MRSA colonization surveillance program. It is not intended to diagnose MRSA infection nor to guide or monitor treatment for MRSA infections. Performed at [facility], [place]  [address] [place], [place] [address]   Culture, blood (routine x 2)     Status: None   Collection Time: 2018-04-01  7:30 AM  Result Value Ref Range Status   Specimen Description BLOOD RIGHT HAND  Final   Special Requests AEROBIC BOTTLE ONLY Blood Culture adequate volume  Final   Culture   Final    NO GROWTH 5 DAYS Performed at [facility], [place] [address], [place], [place] [address]    Report Status 03/30/2018 FINAL  Final  Culture, blood (routine x 2)     Status: None   Collection Time: 01-Apr-2018  7:36 AM  Result Value Ref Range Status   Specimen Description BLOOD RIGHT HAND  Final   Special Requests   Final    AEROBIC BOTTLE ONLY Blood Culture results may not be optimal due to an inadequate volume of blood received in culture bottles   Culture   Final    NO GROWTH 5 DAYS Performed at [facility], [place] [address], [place], [place] [address]    Report Status 03/30/2018 FINAL  Final    Lab Basic Metabolic Panel: Recent Labs  Lab  04-01-18 0048 04-01-18 0049 04-01-18 0716 04-01-18 1201  NA  --  150* 149* 147*  K  --  5.6* 5.7* 4.9  CL  --  119* 117*  --   CO2  --  16* 16*  --   GLUCOSE  --  368* 404*  --   BUN  --  91* 118*  --   CREATININE 5.10* 4.83* 5.21*  --   CALCIUM  --  9.9 10.1  --   MG  --   --  2.3  --    Liver Function Tests: Recent Labs  Lab Apr 01, 2018 0049 04/01/18 0716  AST 50* 63*  ALT 54* 49*  ALKPHOS 170* 143*  BILITOT 1.6* 1.6*  PROT 7.1 6.1*  ALBUMIN 3.7 3.3*   No results for input(s): LIPASE, AMYLASE in the last 168 hours. No results for input(s): AMMONIA in the last 168 hours. CBC: Recent Labs  Lab Apr 01, 2018 0049 04/01/18 0716 04/01/2018 1201  WBC 9.2 9.3  --   NEUTROABS 7.8*  --   --   HGB 15.5* 14.1 13.6  HCT 53.1* 48.8* 40.0  MCV 100.6* 100.4*  --   PLT 91* 105*  --    Cardiac Enzymes: Recent Labs  Lab 04/01/18 0049 Apr 01, 2018 0716  CKTOTAL 1,480* 2,092*   Sepsis Labs: Recent Labs  Lab 04/01/18 0049 04/01/18 0716  WBC 9.2 9.3    Procedures/Operations  Mechanical ventilation.   Hance Caspers 03/30/2018, 1:58 PM

## 2018-04-12 NOTE — Progress Notes (Signed)
NAME:  ASHER BABILONIA, MRN:  [id number], DOB:  07-Jun-1933, LOS: 0 ADMISSION DATE:  21-Apr-2018, CONSULTATION DATE:  21-Apr-2018 REFERRING MD:  Dr. Rory Percy, CHIEF COMPLAINT:  AMS  Brief History   5 yoF missing since Wednesday found with altered mental status in car grocery store. Found to have large ICH with surrounding cytotoxic edema.  Intubated for airway protection.      Past Medical History  CKD stage 5 with L AVF, HTN, DM, CAD, asthma, anemia, arthritis   Significant Hospital Events   3/11 missing 3/13 Admitted 3/14: Seen by neurosurgery.  Deemed not a surgical candidate recommending against decompressive hemic craniectomy.  Currently getting IV mannitol Consults:  Neurosurgery 3/14 Critical care 3/13 Procedures:  3/14 ETT >>  Significant Diagnostic Tests:  3/13 CTH >>  1. Large confluent area of evolving cytotoxic edema within the left MCA distribution, most consistent with acute left MCA territory infarct. Associated hemorrhagic transformation with parenchymal hemorrhage involving the left frontal lobe and left basal ganglia. A possible underlying aneurysmal bleed arising from the left MCA bifurcation with secondary infarct could conceivably considered as well, although is felt to be less likely. Further assessment with dedicated CTA and/or MRA recommended to evaluate for this possibility. 2. Associated regional mass effect with 9 mm left-to-right shift. No frank hydrocephalus or ventricular trapping at this time.  3/13 CTH, CTA >> 1. No significant interval change in large confluent area of evolving cytotoxic edema within the left MCA territory, compatible with evolving acute left MCA territory infarct. Associated hemorrhagic transformation with parenchymal hemorrhage involving the left frontal lobe and left basal ganglia also unchanged. 2. Associated regional mass effect with 10 mm left-to-right shift, Relatively stable. No hydrocephalus or ventricular trapping at this Time. 1.  Irregular filling with multifocal near occlusive stenoses involving a proximal left M2 branch as above. Finding felt to most likely reflect subocclusive and/or recanalized thrombus, although severe multifocal stenoses may be contributory as well. Perfusion is seen distally within the infarcted left MCA territory. No aneurysm or other vascular abnormality seen underlying the left cerebral hemorrhage. 2. Atheromatous plaque about the carotid bifurcations with up to 40-50% stenosis on the right and 30% on the left. 3. Additional atherosclerotic change involving the intracranial circulation as above, with most notable findings including moderate to severe segmental right P2 stenoses, with moderate multifocal bilateral V4 stenoses. 4. Volume loss with irregular pleuroparenchymal scarring within the right lung, partially visualized. 5. Two separate well-circumscribed left parotid masses measuring up to 2 cm, indeterminate. Nonemergent outpatient ENT referral and consultation could be performed for further workup and evaluation as clinically desired.  Micro Data:  3/14 MRSA PCR >> 3/14 BCx 2 >> 3/14 UC >> 3/14 trach asp >> 3/14 RVP>>  Antimicrobials:  3/14 azithro >> 3/14 ceftriaxone >>  Interim history/subjective:   Minimally responsive Objective   Blood pressure (Abnormal) 163/53, pulse 92, temperature (Abnormal) 96.4 F (35.8 C), temperature source Axillary, resp. rate 15, height 5\' 4"  (1.626 m), weight 66.1 kg, SpO2 100 %.    Vent Mode: PRVC FiO2 (%):  [100 %] 100 % Set Rate:  [15 bmp] 15 bmp Vt Set:  [400 mL] 400 mL PEEP:  [5 cmH20] 5 cmH20 Plateau Pressure:  [22 cmH20-23 cmH20] 22 cmH20   Intake/Output Summary (Last 24 hours) at April 21, 2018 1143 Last data filed at 04-21-2018 0800 Gross per 24 hour  Intake 519.36 ml  Output no documentation  Net 519.36 ml   Filed Weights   2018/04/21 0024 Apr 21, 2018 5809  Weight: 69.4 kg 66.1 kg    Examination:  General: 85 year old female with  what appears to be devastating CVA she is unresponsive with the exception of withdrawing to pain HEENT: Normal cephalic atraumatic orally intubated Pulmonary: Clear to auscultation diminished bases Cardiac: Regular rate and rhythm Abdomen: Soft nontender  extremities: Warm and dry GU: Clear yellow  Resolved Hospital Problem list    Assessment & Plan:  Large ICH with surrounding cytotoxic edema - resultant right hemiplegia, hemiparesis and dysarthria  - ddx ischemic stroke with hemorrhagic transformation vs primary ICH, less likely aneurysmal given neg CTA findings Plan Continue SBP goal less than 140 Hypertonic saline, goal 1 45-1 55 Serial neuro checks Follow-up imaging per neurology  Acute hypoxic respiratory failure  R/o aspiration pneumonia Asthma- non smoker Plan Continuing full ventilator support PAD protocol with RASS goal 0-1 Follow-up culture data as well as respiratory viral panel Follow-up a.m. CBC Follow-up pending sputum culture Day #1 ceftriaxone and a azithromycin  Hypertensive urgency Prolonged QTc CAD, HTN, HLD - EKG without acute ischemic changes, but new LVH Plan Continuing Cardene drip for systolic blood pressure goal less than 140 Holding home Norvasc aspirin Lipitor and Lasix Follow-up echocardiogram   Fluid and electrolyte imbalance: Hypernatremia, actually corrected 154 initially, hyperkalemia, hyperchloremia Plan Continuing 3% saline protocol Serial chemistries  Metabolic acidosis Appears to be a mix of gap and anion gap process Plan Check lactate Check hydroxybutyric acid Follow-up ABG  CKD stage 5 Plan Renal dose medications Serial chemistries Strict intake output  DM Plan Insulin drip  Best practice:  Diet: NPO Pain/Anxiety/Delirium protocol (if indicated): propofol/ prn fentanyl VAP protocol (if indicated): yes DVT prophylaxis: SCDs only GI prophylaxis: PPI Glucose control: SSI Mobility: BR Code Status: full  Family  Communication: Son, Fritz Pickerel at bedside. [phone] Disposition: 85 year old female who is critically ill following an acute what appears to be devastating CVA with hemorrhagic conversion and resultant cytotoxic edema.  She is minimally responsive and not a candidate for surgery.  I doubt she will survive this.  She would need central venous access, and arterial access to continue therapeutic interventions aimed at decreasing intracranial pressure however I doubt this will make any difference.  Awaiting her son to arrive, neurology is planning on goals of care discussion    Critical care time: 32 min    Erick Colace ACNP-BC [place] Pager # [phone] OR # [phone] if no answer

## 2018-04-12 NOTE — ED Triage Notes (Signed)
Patient brought in by EMS, states she has been missing since Wednesday and was found tonight sitting in the car at [employer]. Patient awake but only responsive to pain and ammonia. CBG 454.

## 2018-04-12 NOTE — ED Notes (Addendum)
MD Rory Percy speaking with family about need for intubation at this time. Family agrees with plan of care.   ED provider to intubate.

## 2018-04-12 NOTE — ED Notes (Signed)
Left ed at 0400 via carelink. Attempted to call report to charge nurse. Report to be given by carelink per charge

## 2018-04-12 NOTE — Progress Notes (Signed)
Patient ID: Donna Ochoa, female   DOB: Sep 19, 1933, 85 y.o.   MRN: [id number] We were called in the early hours of the morning to review the CT scan on this unfortunate 85 year old female with stage V kidney disease and now rhabdomyolysis who was found slumped over in her car in a parking lot after not being seen for a couple of days.  CT shows a very large left MCA infarct with hemorrhagic conversion in the deep tissues into the basal ganglia with mass-effect and midline shift.  Surgery is not indicated given the situation.  Would not recommend decompressive hemicraniectomy or evacuation of stroke or hematoma, especially given this is the dominant hemisphere.  The hematoma is deep.  Given her age and her comorbidities I suspect the prognosis for any type of functional recovery to good quality of life is grim.

## 2018-04-12 NOTE — ED Notes (Signed)
ED TO INPATIENT HANDOFF REPORT  ED Nurse Name and Phone #:   Joellen Jersey [phone]  S Name/Age/Gender Donna Ochoa 85 y.o. female Room/Bed: TRAAC/TRAAC  Code Status   Code Status: Full Code  Home/SNF/Other Rehab {Patient oriented to: A&O x 0  Is this baseline? No   Triage Complete: Triage complete  Chief Complaint altered mental status  Triage Note Patient brought in by EMS, states she has been missing since Wednesday and was found tonight sitting in the car at [employer]. Patient awake but only responsive to pain and ammonia. CBG 454.    Allergies Allergies  Allergen Reactions  . Codeine Shortness Of Breath and Nausea And Vomiting  . Tramadol Nausea And Vomiting    Level of Care/Admitting Diagnosis ED Disposition    ED Disposition Condition [facility] Area: [place] [[id number]]  Level of Care: ICU [6]  Diagnosis: ICH (intracerebral hemorrhage) [facility]) [[id number]]  Admitting Physician: Amie Portland [[id number]]  Attending Physician: Amie Portland [[id number]]  Estimated length of stay: 5 - 7 days  Certification:: I certify this patient will need inpatient services for at least 2 midnights  PT Class (Do Not Modify): Inpatient [101]  PT Acc Code (Do Not Modify): Private [1]       B Medical/Surgery History Past Medical History:  Diagnosis Date  . Anemia   . Arthritis   . Asthma   . Chronic kidney disease    [facility]  . Coronary artery disease 04/2014   1 stent  . Diabetes mellitus without complication ([place])    type 2  . Dyspnea    when she has too much fluid  . Head injury with loss of consciousness ([place]) 2014  . History of blood transfusion   . Hypertension   . Pneumonia    03/29/16- years ago   Past Surgical History:  Procedure Laterality Date  . ABDOMINAL HYSTERECTOMY  1975  . AV FISTULA PLACEMENT Left 11/13/2015   Procedure: ARTERIOVENOUS (AV) FISTULA CREATION UPPER ARM LEFT;  Surgeon: Serafina Mitchell, MD;  Location: [facility];  Service: Vascular;  Laterality: Left;  . BASCILIC VEIN TRANSPOSITION Left 03/30/2016   Procedure: 2ND STAGE BASILIC VEIN TRANSPOSITION;  Surgeon: Conrad Bellflower, MD;  Location: [facility];  Service: Vascular;  Laterality: Left;  . COLONOSCOPY    . CORONARY ANGIOPLASTY WITH STENT PLACEMENT    . ROTATOR CUFF REPAIR Right      A IV Location/Drains/Wounds Patient Lines/Drains/Airways Status   Active Line/Drains/Airways    Name:   Placement date:   Placement time:   Site:   Days:   Peripheral IV Apr 20, 2018 Right Antecubital   2018/04/20    0045    Antecubital   less than 1   Peripheral IV 2018-04-20 Right;Upper Arm   04-20-18    0401    Arm   less than 1   Fistula / Graft Left Upper arm Arteriovenous fistula   11/13/15    0801    Upper arm   863   NG/OG Tube Orogastric 16 Fr. Center mouth Aucultation   2018-04-20    9833    Center mouth   less than 1   Incision (Closed) 11/13/15 Arm Left   11/13/15    0805     863   Incision (Closed) 03/30/16 Arm Left   03/30/16    1326     725          Intake/Output Last 24 hours  Intake/Output Summary (Last 24 hours) at 04/07/18 0617 Last data filed at 2018/04/07 0344 Gross per 24 hour  Intake 500.21 ml  Output -  Net 500.21 ml    Labs/Imaging Results for orders placed or performed during the hospital encounter of 04/07/18 (from the past 48 hour(s))  I-Stat Creatinine, ED (not at [facility])     Status: Abnormal   Collection Time: 04/07/2018 12:48 AM  Result Value Ref Range   Creatinine, Ser 5.10 (H) 0.44 - 1.00 mg/dL  Ethanol     Status: None   Collection Time: 04/07/2018 12:49 AM  Result Value Ref Range   Alcohol, Ethyl (B) <10 <10 mg/dL    Comment: (NOTE) Lowest detectable limit for serum alcohol is 10 mg/dL. For medical purposes only. Performed at [facility], [address] [place], [place] [address]   Protime-INR     Status: Abnormal   Collection Time: April 07, 2018 12:49 AM  Result Value Ref Range   Prothrombin Time 15.5 (H) 11.4 - 15.2 seconds   INR  1.2 0.8 - 1.2    Comment: (NOTE) INR goal varies based on device and disease states. Performed at [facility] [address] [place], [place] [address]   APTT     Status: None   Collection Time: Apr 07, 2018 12:49 AM  Result Value Ref Range   aPTT 27 24 - 36 seconds    Comment: Performed at [facility], [address] [place], [place] [address]  CBC     Status: Abnormal   Collection Time: 04/07/18 12:49 AM  Result Value Ref Range   WBC 9.2 4.0 - 10.5 K/uL   RBC 5.28 (H) 3.87 - 5.11 MIL/uL   Hemoglobin 15.5 (H) 12.0 - 15.0 g/dL   HCT 53.1 (H) 36.0 - 46.0 %   MCV 100.6 (H) 80.0 - 100.0 fL   MCH 29.4 26.0 - 34.0 pg   MCHC 29.2 (L) 30.0 - 36.0 g/dL   RDW 16.6 (H) 11.5 - 15.5 %   Platelets 91 (L) 150 - 400 K/uL    Comment: SPECIMEN CHECKED FOR CLOTS   nRBC 0.3 (H) 0.0 - 0.2 %    Comment: Performed at [facility], [address], [place], [place] [address]  Differential     Status: Abnormal   Collection Time: 2018-04-07 12:49 AM  Result Value Ref Range   Neutrophils Relative % 85 %   Neutro Abs 7.8 (H) 1.7 - 7.7 K/uL   Lymphocytes Relative 9 %   Lymphs Abs 0.8 0.7 - 4.0 K/uL   Monocytes Relative 6 %   Monocytes Absolute 0.5 0.1 - 1.0 K/uL   Eosinophils Relative 0 %   Eosinophils Absolute 0.0 0.0 - 0.5 K/uL   Basophils Relative 0 %   Basophils Absolute 0.0 0.0 - 0.1 K/uL   Immature Granulocytes 0 %   Abs Immature Granulocytes 0.04 0.00 - 0.07 K/uL    Comment: Performed at [facility], [address] [place], [place] [address]  Comprehensive metabolic panel     Status: Abnormal   Collection Time: 2018/04/07 12:49 AM  Result Value Ref Range   Sodium 150 (H) 135 - 145 mmol/L   Potassium 5.6 (H) 3.5 - 5.1 mmol/L   Chloride 119 (H) 98 - 111 mmol/L   CO2 16 (L) 22 - 32 mmol/L   Glucose, Bld 368 (H) 70 - 99 mg/dL   BUN 91 (H) 8 - 23 mg/dL    Comment: RESULTS CONFIRMED BY MANUAL DILUTION   Creatinine, Ser 4.83 (  H) 0.44 - 1.00 mg/dL   Calcium 9.9 8.9 - 10.3 mg/dL   Total Protein 7.1  6.5 - 8.1 g/dL   Albumin 3.7 3.5 - 5.0 g/dL   AST 50 (H) 15 - 41 U/L   ALT 54 (H) 0 - 44 U/L   Alkaline Phosphatase 170 (H) 38 - 126 U/L   Total Bilirubin 1.6 (H) 0.3 - 1.2 mg/dL   GFR calc non Af Amer 8 (L) >60 mL/min   GFR calc Af Amer 9 (L) >60 mL/min   Anion gap 15 5 - 15    Comment: Performed at [facility], [address] [place], [place] [address]  CK     Status: Abnormal   Collection Time: 2018/04/14 12:49 AM  Result Value Ref Range   Total CK 1,480 (H) 38 - 234 U/L    Comment: Performed at [facility], [address], [place], [place] [address]  Urine rapid drug screen (hosp performed)     Status: None   Collection Time: 14-Apr-2018  1:21 AM  Result Value Ref Range   Opiates NONE DETECTED NONE DETECTED   Cocaine NONE DETECTED NONE DETECTED   Benzodiazepines NONE DETECTED NONE DETECTED   Amphetamines NONE DETECTED NONE DETECTED   Tetrahydrocannabinol NONE DETECTED NONE DETECTED   Barbiturates NONE DETECTED NONE DETECTED    Comment: (NOTE) DRUG SCREEN FOR MEDICAL PURPOSES ONLY.  IF CONFIRMATION IS NEEDED FOR ANY PURPOSE, NOTIFY LAB WITHIN 5 DAYS. LOWEST DETECTABLE LIMITS FOR URINE DRUG SCREEN Drug Class                     Cutoff (ng/mL) Amphetamine and metabolites    1000 Barbiturate and metabolites    200 Benzodiazepine                 937 Tricyclics and metabolites     300 Opiates and metabolites        300 Cocaine and metabolites        300 THC                            50 Performed at [facility], [address] [place],  [address]   Urinalysis, Routine w reflex microscopic     Status: Abnormal   Collection Time: 04/14/2018  1:21 AM  Result Value Ref Range   Color, Urine YELLOW YELLOW   APPearance CLEAR CLEAR   Specific Gravity, Urine 1.015 1.005 - 1.030   pH 5.0 5.0 - 8.0   Glucose, UA >=500 (A) NEGATIVE mg/dL   Hgb urine dipstick LARGE (A) NEGATIVE   Bilirubin Urine NEGATIVE NEGATIVE   Ketones, ur NEGATIVE NEGATIVE mg/dL   Protein, ur >=300 (A)  NEGATIVE mg/dL   Nitrite NEGATIVE NEGATIVE   Leukocytes,Ua NEGATIVE NEGATIVE   RBC / HPF 0-5 0 - 5 RBC/hpf   WBC, UA 0-5 0 - 5 WBC/hpf   Bacteria, UA RARE (A) NONE SEEN   Squamous Epithelial / LPF 0-5 0 - 5    Comment: Performed at [facility], [address] [place], [place] [address]   Ct Head Wo Contrast  Result Date: 04-14-18 CLINICAL DATA:  Initial evaluation for acute neuro deficit. EXAM: CT HEAD WITHOUT CONTRAST TECHNIQUE: Contiguous axial images were obtained from the base of the skull through the vertex without intravenous contrast. COMPARISON:  Prior MRI from 09/17/2016. FINDINGS: Brain: Large confluent area of evolving cytotoxic edema throughout the left MCA territory, likely reflecting  acute left MCA territory infarct. Superimposed parenchymal hemorrhage at the anterior left frontal lobe and left basal ganglia, favored to reflect hemorrhagic transformation. Associated scattered small volume subarachnoid hemorrhage. Regional mass effect with 9 mm left-to-right shift. No frank hydrocephalus or ventricular trapping at this time. Basilar cisterns are mildly crowded but remain patent. Underlying age-related cerebral atrophy. No other acute intracranial hemorrhage or large vessel territory infarct. No appreciable mass lesion. No extra-axial fluid collection. Vascular: No definite hyperdense vessel, although evaluation limited by adjacent hemorrhage. Calcified atherosclerosis at the skull base. Skull: Scalp soft tissues and calvarium within normal limits. Sinuses/Orbits: Globes and orbital soft tissues demonstrate no acute finding. Right gaze noted. Chronic pansinusitis noted. Mastoids are clear. Other: None. IMPRESSION: 1. Large confluent area of evolving cytotoxic edema within the left MCA distribution, most consistent with acute left MCA territory infarct. Associated hemorrhagic transformation with parenchymal hemorrhage involving the left frontal lobe and left basal ganglia. A possible  underlying aneurysmal bleed arising from the left MCA bifurcation with secondary infarct could conceivably considered as well, although is felt to be less likely. Further assessment with dedicated CTA and/or MRA recommended to evaluate for this possibility. 2. Associated regional mass effect with 9 mm left-to-right shift. No frank hydrocephalus or ventricular trapping at this time. Critical Value/emergent results were called by telephone at the time of interpretation on 04-22-2018 at 1:51 am to Dr. Merrily Pew , who verbally acknowledged these results. Electronically Signed   By: Jeannine Boga M.D.   On: 2018-04-22 02:00    Pending Labs Unresulted Labs (From admission, onward)   None      Vitals/Pain Today's Vitals   22-Apr-2018 0559 04/22/2018 0600 04/22/18 0611 04/22/18 0615  BP:  123/64  95/69  Pulse: 90 90 84 84  Resp: (!) 32 (!) 28 15 20   Temp:      TempSrc:      SpO2: 90% 91% 93% 98%  Weight:      Height:      PainSc:        Isolation Precautions No active isolations  Medications Medications  clevidipine (CLEVIPREX) infusion 0.5 mg/mL (2 mg/hr Intravenous Rate/Dose Change April 22, 2018 0617)  calcium gluconate 1 g in sodium chloride 0.9 % 100 mL IVPB (1 g Intravenous Not Given 2018-04-22 0243)   stroke: mapping our early stages of recovery book (has no administration in time range)  acetaminophen (TYLENOL) tablet 650 mg (has no administration in time range)    Or  acetaminophen (TYLENOL) solution 650 mg (has no administration in time range)    Or  acetaminophen (TYLENOL) suppository 650 mg (has no administration in time range)  senna-docusate (Senokot-S) tablet 1 tablet (has no administration in time range)  pantoprazole (PROTONIX) injection 40 mg (has no administration in time range)  clevidipine (CLEVIPREX) infusion 0.5 mg/mL (has no administration in time range)  propofol (DIPRIVAN) 1000 MG/100ML infusion (10 mcg/kg/min  69.4 kg Intravenous New Bag/Given 04/22/2018 0610)   labetalol (NORMODYNE,TRANDATE) injection 5 mg (5 mg Intravenous Given Apr 22, 2018 0155)  sodium chloride 0.9 % bolus 500 mL (0 mLs Intravenous Stopped April 22, 2018 0344)  calcium gluconate in NaCl 1-0.675 GM/50ML-% IVPB (  Stopped April 22, 2018 0344)  iohexol (OMNIPAQUE) 350 MG/ML injection 75 mL (75 mLs Intravenous Contrast Given 2018-04-22 0536)  etomidate (AMIDATE) injection (20 mg Intravenous Given 22-Apr-2018 0601)  rocuronium (ZEMURON) injection (80 mg Intravenous Given 04-22-2018 0602)    Mobility non-ambulatory High fall risk   Focused Assessments Neuro Assessment Handoff:  Swallow screen pass? No  Cardiac  Rhythm: Normal sinus rhythm NIH Stroke Scale ( + Modified Stroke Scale Criteria)  Interval: Shift assessment Level of Consciousness (1a.)   : Not alert, but arousable by minor stimulation to obey, answer, or respond LOC Questions (1b. )   +: Answers neither question correctly LOC Commands (1c. )   + : Performs neither task correctly Best Gaze (2. )  +: Normal Visual (3. )  +: Bilateral hemianopia (blind including cortical blindness) Facial Palsy (4. )    : Minor paralysis Motor Arm, Left (5a. )   +: Drift Motor Arm, Right (5b. )   +: No movement Motor Leg, Left (6a. )   +: Drift Motor Leg, Right (6b. )   +: Drift Limb Ataxia (7. ): Amputation or joint fusion Sensory (8. )   +: Mild-to-moderate sensory loss, patient feels pinprick is less sharp or is dull on the affected side, or there is a loss of superficial pain with pinprick, but patient is aware of being touched Best Language (9. )   +: Mute, global aphasia Dysarthria (10. ): Intubated or other physical barrier Extinction/Inattention (11.)   +: Profound hemi-inattention or extinction to more than one modality Modified SS Total  +: 20 Complete NIHSS TOTAL: 22 Last date known well: 03/22/18   Neuro Assessment:   Neuro Checks:   Initial (04/02/18 0108)  Last Documented NIHSS Modified Score: 20 (04/02/18 0519) Has TPA been given? No If  patient is a Neuro Trauma and patient is going to OR before floor call report to [place] nurse: [phone] or [phone]     R Recommendations: See Admitting Provider Note  Report given to:   Additional Notes:  Intubated. Family at bedside.

## 2018-04-12 NOTE — Consult Note (Deleted)
Neurology Consultation  Reason for Consult: [place] Referring Physician: Dr. Dayna Barker  CC: Found down/altered mental status  History is obtained from: Chart review  HPI: Donna Ochoa is a 85 y.o. female past medical history of chronic kidney disease stage 5 and AV fistula in place in preparation for hemodialysis which was never started, hypertension, diabetes with last known normal sometime on Wednesday when she was last seen by family, found in her car in a parking lot late night 3/13/early morning 04-12-18 brought to [facility] for evaluation.  Noncontrast head CT shows a large confluent area of ischemic stroke superimposed bleed versus primary hypertensive bleed versus an aneurysmal bleed. Transferred to [facility] for further work-up. Awaiting family arrival at this time. GCS 7 (E1, V1, M5)  LKW: 48-72h ago tpa given?: no, OSW Premorbid modified Rankin scale (mRS): unable to obtain ICH score - 3  ROS:  Unable to obtain due to altered mental status.   Past Medical History:  Diagnosis Date  . Anemia   . Arthritis   . Asthma   . Chronic kidney disease    [facility]  . Coronary artery disease 04/2014   1 stent  . Diabetes mellitus without complication ([place])    type 2  . Dyspnea    when she has too much fluid  . Head injury with loss of consciousness ([place]) 2014  . History of blood transfusion   . Hypertension   . Pneumonia    03/29/16- years ago    Family History  Problem Relation Age of Onset  . Diabetes Mother   . Diabetes Father   . Heart failure Sister   . Diabetes Brother   . Heart disease Brother        before age 62  . Diabetes Maternal Grandmother   . Diabetes Maternal Grandfather   . Diabetes Paternal Grandmother   . Diabetes Paternal Grandfather   . Diabetes Sister   . Diabetes Sister   . Diabetes Brother   Documented in chart as above.  Patient unable to provide history.  Social History:   reports that she has never smoked. She  has never used smokeless tobacco. She reports that she does not drink alcohol or use drugs. Documented in the chart as above.  Patient unable to provide history  Medications  Current Facility-Administered Medications:  .  calcium gluconate 1 g in sodium chloride 0.9 % 100 mL IVPB, 1 g, Intravenous, Once, Mesner, Corene Cornea, MD .  clevidipine (CLEVIPREX) infusion 0.5 mg/mL, 0-21 mg/hr, Intravenous, Continuous, Mesner, Corene Cornea, MD, Last Rate: 8 mL/hr at Apr 12, 2018 0339, 4 mg/hr at April 12, 2018 6213  Current Outpatient Medications:  .  amLODipine (NORVASC) 10 MG tablet, Take 1 tablet (10 mg total) by mouth daily., Disp: 90 tablet, Rfl: 1 .  aspirin EC 81 MG tablet, Take 81 mg by mouth daily., Disp: , Rfl:  .  atorvastatin (LIPITOR) 80 MG tablet, Take 1 tablet (80 mg total) by mouth at bedtime., Disp: 90 tablet, Rfl: 1 .  epoetin alfa (EPOGEN,PROCRIT) 08657 UNIT/ML injection, Inject 20,000 Units into the skin every 21 ( twenty-one) days., Disp: , Rfl:  .  furosemide (LASIX) 80 MG tablet, Take 160 mg by mouth 2 (two) times daily., Disp: , Rfl:  .  insulin lispro (HUMALOG) 100 UNIT/ML injection, Inject 20 Units into the skin 2 (two) times daily with breakfast and lunch. With breakfast and lunch , Disp: , Rfl:  .  nitroGLYCERIN (NITROSTAT) 0.4 MG SL tablet, Place 0.4 mg  under the tongue every 5 (five) minutes as needed for chest pain., Disp: , Rfl:  .  sodium bicarbonate 650 MG tablet, Take 1,300 mg by mouth 2 (two) times daily., Disp: , Rfl:   Exam: Current vital signs: BP 139/74   Pulse 83   Temp 98.7 F (37.1 C) (Rectal)   Resp (!) 25   Ht 5\' 6"  (1.676 m)   Wt 69.4 kg   SpO2 96%   BMI 24.69 kg/m  Vital signs in last 24 hours: Temp:  [98.7 F (37.1 C)] 98.7 F (37.1 C) (03/14 0030) Pulse Rate:  [77-90] 83 (03/14 0320) Resp:  [16-28] 25 (03/14 0345) BP: (138-171)/(65-80) 139/74 (03/14 0345) SpO2:  [88 %-97 %] 96 % (03/14 0320) Weight:  [69.4 kg] 69.4 kg (03/14 0024) General: Patient is very  drowsy, does not open eyes or follow any commands. HEENT: Normocephalic atraumatic, dry oral mucous membranes CVS: S1-2 regular rate rhythm Respiratory: Mildly increased work of breathing, saturating at 92% after trumpet insertion in the nares by the ED provider on high flow oxygen via nasal cannula Extremities: Left upper extremity bruit heard from presumed dialysis fistula, 1-2+ pitting edema in both lower extremities. Neurological exam Patient is very drowsy, does not open eyes or follow any commands to voice. She does not open eyes to noxious stimulation. Her pupils are equal sluggishly reactive to light She does not have a gaze preference but has roving eye movements. She does not blink to threat from either side She has right lower facial weakness evident at rest She is spontaneously moving the left upper and lower extremity but not to command. She attempts to localize with the left side when noxious simulation is given to the right upper and lower extremity. She actively withdraws left upper and lower extremity to noxious stimulation.  NIHSS 1a Level of Conscious.: 2 1b LOC Questions: 2 1c LOC Commands: 2 2 Best Gaze: 0 3 Visual: 0 4 Facial Palsy: 2 5a Motor Arm - left: 1 5b Motor Arm - Right: 4 6a Motor Leg - Left: 1 6b Motor Leg - Right: 4 7 Limb Ataxia: 0 8 Sensory: 1 9 Best Language: 3 10 Dysarthria: 2 11 Extinct. and Inatten.: 0 TOTAL: 24  Labs I have reviewed labs in epic and the results pertinent to this consultation are: Hemoglobin 15.5, hematocrit 53.1, platelet count 91,000, sodium 150, potassium 5.6, bicarb 16, glucose 368, BUN 91, creatinine 4.83 CK-1480 Alkaline phosphatase 170, AST 50, ALT 54, bilirubin 1.6  CBC    Component Value Date/Time   WBC 9.2 04/22/18 0049   RBC 5.28 (H) Apr 22, 2018 0049   HGB 15.5 (H) 04-22-18 0049   HCT 53.1 (H) 2018-04-22 0049   PLT 91 (L) 04/22/2018 0049   MCV 100.6 (H) 2018-04-22 0049   MCH 29.4 04-22-2018 0049    MCHC 29.2 (L) April 22, 2018 0049   RDW 16.6 (H) 04/22/18 0049   LYMPHSABS 0.8 2018/04/22 0049   MONOABS 0.5 04-22-2018 0049   EOSABS 0.0 Apr 22, 2018 0049   BASOSABS 0.0 04-22-2018 0049    CMP     Component Value Date/Time   NA 150 (H) 04/22/18 0049   K 5.6 (H) 2018-04-22 0049   CL 119 (H) 04/22/2018 0049   CO2 16 (L) 04/22/2018 0049   GLUCOSE 368 (H) 04-22-18 0049   BUN 91 (H) 04/22/2018 0049   CREATININE 4.83 (H) 2018-04-22 0049   CALCIUM 9.9 04/22/18 0049   CALCIUM 10.0 09/23/2017 0919   PROT 7.1 April 22, 2018 0049  ALBUMIN 3.7 03-30-18 0049   AST 50 (H) 2018/03/30 0049   ALT 54 (H) 2018/03/30 0049   ALKPHOS 170 (H) 2018-03-30 0049   BILITOT 1.6 (H) 03/30/2018 0049   GFRNONAA 8 (L) March 30, 2018 0049   GFRAA 9 (L) 03-30-2018 0049   Imaging I have reviewed the images obtained: CT-scan of the brain shows large confluent areas of bleeding in the left MCA territory along with evidence of possible cytotoxic edema and midline shift from the right of about 10 mm.  Differentials based on the appearance are ischemic stroke followed by hemorrhagic transformation versus primary ICH versus left MCA aneurysmal bleed.  CTA head and neck-formal read pending.  No emergent LVO on my read.  Assessment: 85 year old man past history of chronic kidney disease hypertension diabetes test on some time Wednesday night found less responsive and altered in her car and brought for evaluation. CT head showed a large ICH with surrounding cytotoxic edema making ischemic stroke with hemorrhagic transformation versus a primary ICH on the differentials. Concern for aneurysmal bleed from a left MCA aneurysm less likely because of the CTA not showing any aneurysmal findings at this time. Had a detailed discussion with the family-son at bedside.  He would like the patient to be full code for now. At this time, she is a GCS 7 and would require intubation for airway protection.  I have requested ED provider Dr.  Christy Gentles for assistance with intubation.  Impression: ICH AIS  Recommendations: Assessment:   Plan: Subcortical ICH, nontraumatic Cortical ICH, nontraumatic  Acuity: Acute Laterality: left Current suspected etiology:   HTN, ESRD, Amyloid Treatment: -Admit to NICU -ICH Score: 3 -BP control goal SYS<140 -Neurosurgery was consulted over the phone per the outside ED and recommended no intervention. -PT/OT/ST  -neuromonitoring -MRI brain without contrast when able to  CNS Cerebral edema Compression of brain -Hyperosmolar therapy would be desirable but she her sodium is already 150.  Goal sodium 1 45-1 55.  Can start 3% if the sodium falls below goal. -Close neuro monitoring No evidence of hydrocephalus for now  Dysarthria Dysphagia following ICH  -May need PEG  Hemiplegia and hemiparesis following nontraumatic intracerebral hemorrhage affecting right dominant side  - PT/OT/ST when able to  RESP Acute Respiratory Failure  -vent management per ICU -wean when able  CV Hypertensive Encephalopathy Hypertensive Emergency -Aggressive BP control, goal SBP < 140 -Cleviprex as needed -TTE  GI/GU CKD Stage 5 (GFR < 15) with possible acute on chronic injury -Might need dialysis-Will defer to PCCM -She has an AV fistula which was put in anticipation for dialysis potassium is now started. -avoid nephrotoxic agents -Consider renal consult as patient has received IV iodinated contrast for CTA head/neck  Rhabdomyolysis -Gentle hydration -Consider renal consult-will defer to primary team and PCCM.  HEME Hemoconcentrated. -Monitor -Gentle hydration and repeat labs.  ENDO Type 2 diabetes mellitus with hyperglycemia  -SSI -goal HgbA1c < 7 -Appreciate PCCM assistance for management  Fluid/Electrolyte Disorders Hyperkalemia -Management per PCCM  ID Possible Aspiration PNA -CXR -NPO -Monitor  Prophylaxis DVT: SCDs GI: PPI Bowel: Docusate senna  Dispo:SNF   Diet: NPO until cleared by speech  Code Status: Full Code  I had a detailed discussion with the son regarding CODE STATUS.  He is understanding of the gravity of the situation and the extent of damage that the bleed and the edema is causing on her brain but at this point would like her to be full code.   THE FOLLOWING WERE PRESENT ON  ADMISSION: Acute ischemic stroke ICH Midline shift Cerebral edema Hemiplegia Possible aspiration pneumonia CKD 5 Rhabdomyolysis  -- Amie Portland, MD [facility] Pager: [phone] If 7pm to 7am, please call on call as listed on AMION.   CRITICAL CARE ATTESTATION Performed by: Amie Portland, MD Total critical care time: 55 minutes Critical care time was exclusive of separately billable procedures and treating other patients and/or supervising APPs/Residents/Students Critical care was necessary to treat or prevent imminent or life-threatening deterioration due to [place], AIS, CKD, Hyperglycemia, Rhabdomyolysis, Hyperkalemia,   This patient is critically ill and at significant risk for neurological worsening and/or death and care requires constant monitoring. Critical care was time spent personally by me on the following activities: development of treatment plan with patient and/or surrogate as well as nursing, discussions with consultants, evaluation of patient's response to treatment, examination of patient, obtaining history from patient or surrogate, ordering and performing treatments and interventions, ordering and review of laboratory studies, ordering and review of radiographic studies, pulse oximetry, re-evaluation of patient's condition, participation in multidisciplinary rounds and medical decision making of high complexity in the care of this patient.

## 2018-04-12 NOTE — Progress Notes (Signed)
Order for arterial line was placed this morning. NP said to hold off on that for now.

## 2018-04-12 NOTE — Code Documentation (Signed)
Christy Gentles, MD attempting intubation. Successful attempt, confirmed by color-change capnometry & auscultation.

## 2018-04-12 NOTE — ED Provider Notes (Signed)
Patient arrives from [facility] for neurologic evaluation. She has altered mental status found to have parenchymal hemorrhage. It was unclear if this was from evolution of the stroke or an aneurysmal bleed.  She was sent to the emergency department for evaluation by neurology.  Discussed with Dr. Lorraine Lax It appears patient can receive a CT with contrast as she is on dialysis with a fistula in her left arm. This will be performed to evaluate any aneurysms.  Patient did have some hypoxia, but this is improved with the nasal trumpet. Family should be on the way. We will defer intubation for now as her hypoxia has improved   Ripley Fraise, MD 04/05/18 [phone]

## 2018-04-12 NOTE — Progress Notes (Signed)
OT Cancellation Note  Patient Details Name: Donna Ochoa MRN: [id number] DOB: August 26, 1933   Cancelled Treatment:    Reason Eval/Treat Not Completed: Active bedrest order.  [place], OTR/L Acute Rehab Pager: [phone] Office: [phone] 04/03/2018, 7:06 AM

## 2018-04-12 NOTE — H&P (Addendum)
Neurology Consultation   CC: Found down/altered mental status  History is obtained from: Chart review  HPI: Donna Ochoa is a 85 y.o. female past medical history of chronic kidney disease stage 5 and AV fistula in place in preparation for hemodialysis which was never started, hypertension, diabetes with last known normal sometime on Wednesday when she was last seen by family, found in her car in a parking lot late night 3/13/early morning March 31, 2018 brought to [facility] for evaluation.  Noncontrast head CT shows a large confluent area of ischemic stroke superimposed bleed versus primary hypertensive bleed versus an aneurysmal bleed. Transferred to [facility] for further work-up. Awaiting family arrival at this time. GCS 7 (E1, V1, M5)  LKW: 48-72h ago tpa given?: no, OSW Premorbid modified Rankin scale (mRS): unable to obtain ICH score - 3  ROS:  Unable to obtain due to altered mental status.       Past Medical History:  Diagnosis Date   Anemia    Arthritis    Asthma    Chronic kidney disease    [facility]   Coronary artery disease 04/2014   1 stent   Diabetes mellitus without complication ([place])    type 2   Dyspnea    when she has too much fluid   Head injury with loss of consciousness ([place]) 2014   History of blood transfusion    Hypertension    Pneumonia    03/29/16- years ago         Family History  Problem Relation Age of Onset   Diabetes Mother    Diabetes Father    Heart failure Sister    Diabetes Brother    Heart disease Brother        before age 61   Diabetes Maternal Grandmother    Diabetes Maternal Grandfather    Diabetes Paternal Grandmother    Diabetes Paternal Grandfather    Diabetes Sister    Diabetes Sister    Diabetes Brother   Documented in chart as above.  Patient unable to provide history.  Social History:   reports that she has never smoked. She has never  used smokeless tobacco. She reports that she does not drink alcohol or use drugs. Documented in the chart as above.  Patient unable to provide history  Medications  Current Facility-Administered Medications:    calcium gluconate 1 g in sodium chloride 0.9 % 100 mL IVPB, 1 g, Intravenous, Once, Mesner, Corene Cornea, MD   clevidipine (CLEVIPREX) infusion 0.5 mg/mL, 0-21 mg/hr, Intravenous, Continuous, Mesner, Corene Cornea, MD, Last Rate: 8 mL/hr at 2018-03-31 0339, 4 mg/hr at 03/31/2018 4008  Current Outpatient Medications:    amLODipine (NORVASC) 10 MG tablet, Take 1 tablet (10 mg total) by mouth daily., Disp: 90 tablet, Rfl: 1   aspirin EC 81 MG tablet, Take 81 mg by mouth daily., Disp: , Rfl:    atorvastatin (LIPITOR) 80 MG tablet, Take 1 tablet (80 mg total) by mouth at bedtime., Disp: 90 tablet, Rfl: 1   epoetin alfa (EPOGEN,PROCRIT) 67619 UNIT/ML injection, Inject 20,000 Units into the skin every 21 ( twenty-one) days., Disp: , Rfl:    furosemide (LASIX) 80 MG tablet, Take 160 mg by mouth 2 (two) times daily., Disp: , Rfl:    insulin lispro (HUMALOG) 100 UNIT/ML injection, Inject 20 Units into the skin 2 (two) times daily with breakfast and lunch. With breakfast and lunch , Disp: , Rfl:    nitroGLYCERIN (NITROSTAT) 0.4 MG SL tablet, Place 0.4  mg under the tongue every 5 (five) minutes as needed for chest pain., Disp: , Rfl:    sodium bicarbonate 650 MG tablet, Take 1,300 mg by mouth 2 (two) times daily., Disp: , Rfl:   Exam: Current vital signs: BP 139/74    Pulse 83    Temp 98.7 F (37.1 C) (Rectal)    Resp (!) 25    Ht 5\' 6"  (1.676 m)    Wt 69.4 kg    SpO2 96%    BMI 24.69 kg/m  Vital signs in last 24 hours: Temp:  [98.7 F (37.1 C)] 98.7 F (37.1 C) (03/14 0030) Pulse Rate:  [77-90] 83 (03/14 0320) Resp:  [16-28] 25 (03/14 0345) BP: (138-171)/(65-80) 139/74 (03/14 0345) SpO2:  [88 %-97 %] 96 % (03/14 0320) Weight:  [69.4 kg] 69.4 kg (03/14 0024) General: Patient is very drowsy,  does not open eyes or follow any commands. HEENT: Normocephalic atraumatic, dry oral mucous membranes CVS: S1-2 regular rate rhythm Respiratory: Mildly increased work of breathing, saturating at 92% after trumpet insertion in the nares by the ED provider on high flow oxygen via nasal cannula Extremities: Left upper extremity bruit heard from presumed dialysis fistula, 1-2+ pitting edema in both lower extremities. Neurological exam Patient is very drowsy, does not open eyes or follow any commands to voice. She does not open eyes to noxious stimulation. Her pupils are equal sluggishly reactive to light She does not have a gaze preference but has roving eye movements. She does not blink to threat from either side She has right lower facial weakness evident at rest She is spontaneously moving the left upper and lower extremity but not to command. She attempts to localize with the left side when noxious simulation is given to the right upper and lower extremity. She actively withdraws left upper and lower extremity to noxious stimulation.  NIHSS 1a Level of Conscious.: 2 1b LOC Questions: 2 1c LOC Commands: 2 2 Best Gaze: 0 3 Visual: 0 4 Facial Palsy: 2 5a Motor Arm - left: 1 5b Motor Arm - Right: 4 6a Motor Leg - Left: 1 6b Motor Leg - Right: 4 7 Limb Ataxia: 0 8 Sensory: 1 9 Best Language: 3 10 Dysarthria: 2 11 Extinct. and Inatten.: 0 TOTAL: 24  Labs I have reviewed labs in epic and the results pertinent to this consultation are: Hemoglobin 15.5, hematocrit 53.1, platelet count 91,000, sodium 150, potassium 5.6, bicarb 16, glucose 368, BUN 91, creatinine 4.83 CK-1480 Alkaline phosphatase 170, AST 50, ALT 54, bilirubin 1.6  CBC Labs(Brief)          Component Value Date/Time   WBC 9.2 08-Apr-2018 0049   RBC 5.28 (H) April 08, 2018 0049   HGB 15.5 (H) 08-Apr-2018 0049   HCT 53.1 (H) April 08, 2018 0049   PLT 91 (L) 2018/04/08 0049   MCV 100.6 (H) 08-Apr-2018 0049   MCH  29.4 2018/04/08 0049   MCHC 29.2 (L) 08-Apr-2018 0049   RDW 16.6 (H) April 08, 2018 0049   LYMPHSABS 0.8 Apr 08, 2018 0049   MONOABS 0.5 04-08-2018 0049   EOSABS 0.0 04-08-18 0049   BASOSABS 0.0 04-08-18 0049      CMP     Labs(Brief)          Component Value Date/Time   NA 150 (H) 04-08-18 0049   K 5.6 (H) 08-Apr-2018 0049   CL 119 (H) 08-Apr-2018 0049   CO2 16 (L) 2018-04-08 0049   GLUCOSE 368 (H) 04/08/18 0049   BUN 91 (H) 04-08-2018  0049   CREATININE 4.83 (H) 04/12/18 0049   CALCIUM 9.9 2018-04-12 0049   CALCIUM 10.0 09/23/2017 0919   PROT 7.1 04/12/18 0049   ALBUMIN 3.7 04/12/18 0049   AST 50 (H) April 12, 2018 0049   ALT 54 (H) 04/12/2018 0049   ALKPHOS 170 (H) 04-12-2018 0049   BILITOT 1.6 (H) 04-12-2018 0049   GFRNONAA 8 (L) 04-12-18 0049   GFRAA 9 (L) 04/12/18 0049     Imaging I have reviewed the images obtained: CT-scan of the brain shows large confluent areas of bleeding in the left MCA territory along with evidence of possible cytotoxic edema and midline shift from the right of about 10 mm.  Differentials based on the appearance are ischemic stroke followed by hemorrhagic transformation versus primary ICH versus left MCA aneurysmal bleed.  CTA head and neck-formal read pending.  No emergent LVO on my read.  Assessment: 85 year old man past history of chronic kidney disease hypertension diabetes test on some time Wednesday night found less responsive and altered in her car and brought for evaluation. CT head showed a large ICH with surrounding cytotoxic edema making ischemic stroke with hemorrhagic transformation versus a primary ICH on the differentials. Concern for aneurysmal bleed from a left MCA aneurysm less likely because of the CTA not showing any aneurysmal findings at this time. Had a detailed discussion with the family-son at bedside.  He would like the patient to be full code for now. At this time, she is a GCS 7 and  would require intubation for airway protection.  I have requested ED provider Dr. Christy Gentles for assistance with intubation.  Impression: ICH AIS  Recommendations: Assessment:   Plan: Subcortical ICH, nontraumatic Cortical ICH, nontraumatic  Acuity: Acute Laterality: left Current suspected etiology:   HTN, ESRD, Amyloid Treatment: -Admit to NICU -ICH Score: 3 -BP control goal SYS<140 -Neurosurgery was consulted over the phone per the outside ED and recommended no intervention. -PT/OT/ST  -neuromonitoring -MRI brain without contrast when able to  CNS Cerebral edema Compression of brain -Hyperosmolar therapy would be desirable but she her sodium is already 150.  Goal sodium 1 45-1 55.  Can start 3% if the sodium falls below goal. -Close neuro monitoring No evidence of hydrocephalus for now  Dysarthria Dysphagia following ICH  -May need PEG  Hemiplegia and hemiparesis following nontraumatic intracerebral hemorrhage affecting right dominant side  - PT/OT/ST when able to  RESP Acute Respiratory Failure  -vent management per ICU -wean when able  CV Hypertensive Encephalopathy Hypertensive Emergency -Aggressive BP control, goal SBP < 140 -Cleviprex as needed -TTE  GI/GU CKD Stage 5 (GFR < 15) with possible acute on chronic injury -Might need dialysis-Will defer to PCCM -She has an AV fistula which was put in anticipation for dialysis potassium is now started. -avoid nephrotoxic agents -Consider renal consult as patient has received IV iodinated contrast for CTA head/neck  Rhabdomyolysis -Gentle hydration -Consider renal consult-will defer to primary team and PCCM.  HEME Hemoconcentrated. -Monitor -Gentle hydration and repeat labs.  ENDO Type 2 diabetes mellitus with hyperglycemia  -SSI -goal HgbA1c < 7 -Appreciate PCCM assistance for management  Fluid/Electrolyte Disorders Hyperkalemia -Management per PCCM  ID Possible Aspiration  PNA -CXR -NPO -Monitor  Prophylaxis DVT: SCDs GI: PPI Bowel: Docusate senna  Dispo:SNF  Diet: NPO until cleared by speech  Code Status: Full Code  I had a detailed discussion with the son regarding CODE STATUS.  He is understanding of the gravity of the situation and the extent of  damage that the bleed and the edema is causing on her brain but at this point would like her to be full code.   THE FOLLOWING WERE PRESENT ON ADMISSION: Acute ischemic stroke ICH Midline shift Cerebral edema Hemiplegia Possible aspiration pneumonia CKD 5 Rhabdomyolysis  -- Amie [place], MD [facility] Pager: [phone] If 7pm to 7am, please call on call as listed on AMION.   CRITICAL CARE ATTESTATION Performed by: Amie [place], MD Total critical care time: 55 minutes Critical care time was exclusive of separately billable procedures and treating other patients and/or supervising APPs/Residents/Students Critical care was necessary to treat or prevent imminent or life-threatening deterioration due to [place], AIS, CKD, Hyperglycemia, Rhabdomyolysis, Hyperkalemia,   This patient is critically ill and at significant risk for neurological worsening and/or death and care requires constant monitoring. Critical care was time spent personally by me on the following activities: development of treatment plan with patient and/or surrogate as well as nursing, discussions with consultants, evaluation of patient's response to treatment, examination of patient, obtaining history from patient or surrogate, ordering and performing treatments and interventions, ordering and review of laboratory studies, ordering and review of radiographic studies, pulse oximetry, re-evaluation of patient's condition, participation in multidisciplinary rounds and medical decision making of high complexity in the care of this patient

## 2018-04-12 NOTE — ED Provider Notes (Signed)
Pt with fistula in place, but apparently has not required dialysis, but this may need to occur since she received IV contrast  INTUBATION Performed by: Sharyon Cable Required items: required devices, and special equipment available Patient identity confirmed: provided demographic data and hospital-assigned identification number Time out: emergent procedure Indications: altered mental status, hypoxia Intubation method: Glidescope Laryngoscopy  Preoxygenation: non rebreather Sedatives: etomidate Paralytic: rocuronium Tube Size:  cuffed Post-procedure assessment: chest rise and ETCO2 detector Breath sounds: equal and absent over the epigastrium Tube secured with: ETT holder Chest x-ray interpreted by me. Chest x-ray findings: endotracheal tube in appropriate position Patient tolerated the procedure well with no immediate complications.   Neurology Dr. Lorraine Lax has discussed with son, at this point patient is a full code.  She continues to have hypoxia and decline, therefore patient was intubated.  She will be admitted to the ICU    Ripley Fraise, MD 04-24-18 [phone]

## 2018-04-12 NOTE — Progress Notes (Signed)
SLP Cancellation Note  Patient Details Name: Donna Ochoa MRN: [id number] DOB: May 02, 1933   Cancelled treatment:       Reason Eval/Treat Not Completed: Medical issues which prohibited therapy. Per charting pt remains intubated. Will follow up.  Deneise Lever, [place], CCC-SLP Speech-Language Pathologist Acute Rehabilitation Services Pager: [phone] Office: [phone]    Aliene Altes 04-06-18, 8:27 AM

## 2018-04-12 NOTE — ED Notes (Addendum)
Patient's O2 sats dropped 87% on 4L nasal cannula, placed on NRB sats 89%. MD made aware.   MD placed (2) NPA

## 2018-04-12 NOTE — Progress Notes (Signed)
Initial Nutrition Assessment  DOCUMENTATION CODES:   Non-severe (moderate) malnutrition in context of acute illness/injury  INTERVENTION:   If unable to extubate within the next 24 hours, recommend start TF via OGT:  Vital AF 1.2 at 40 ml/h (960 ml per day)  Pro-stat 30 ml BID  Provides 1352 kcal, 102 gm protein, 779 ml free water daily  NUTRITION DIAGNOSIS:   Moderate Malnutrition related to acute illness(missing for 3 days; found in car with ICH) as evidenced by mild fat depletion, mild muscle depletion, moderate muscle depletion, percent weight loss(5% weight loss within 2 weeks).  GOAL:   Patient will meet greater than or equal to 90% of their needs  MONITOR:   Vent status, Labs, Skin, I & O's  REASON FOR ASSESSMENT:   Ventilator    ASSESSMENT:   85 yo female with PMH of HTN, anemia, CAD, asthma, DM, CKD who had been missing since Wednesday; admitted 3/14 after being found in her car with large ICH and surrounding cytotoxic edema. Required intubation on admission.   Patient was deemed not a surgical candidate by neurosurgery. Currently receiving IV mannitol and 3% saline.  Noted poor prognosis.  Patient is currently intubated on ventilator support MV: 8.2 L/min Temp (24hrs), Avg:97.8 F (36.6 C), Min:96.4 F (35.8 C), Max:98.7 F (37.1 C)  Cleviprex and Propofol are currently off.  Labs reviewed. Sodium 149 (H), potassium 5.7 (H), creatinine 5.21 (H), BUN 118 (H) CBG's: [phone] Medications reviewed and include senokot, cleviprex, propofol.  Per review of weight encounters, patient has lost 5% of usual weight within the past 2 weeks, significant for time frame.   NUTRITION - FOCUSED PHYSICAL EXAM:    Most Recent Value  Orbital Region  Mild depletion  Upper Arm Region  Mild depletion  Thoracic and Lumbar Region  Unable to assess  Buccal Region  Unable to assess  Temple Region  Mild depletion  Clavicle Bone Region  Moderate depletion   Clavicle and Acromion Bone Region  Mild depletion  Scapular Bone Region  Unable to assess  Dorsal Hand  Unable to assess  Patellar Region  Unable to assess  Anterior Thigh Region  Unable to assess  Posterior Calf Region  Unable to assess  Edema (RD Assessment)  Unable to assess  Hair  Reviewed  Eyes  Unable to assess  Mouth  Unable to assess  Skin  Reviewed  Nails  Reviewed       Diet Order:   Diet Order            Diet NPO time specified  Diet effective now              EDUCATION NEEDS:   No education needs have been identified at this time  Skin:  Skin Assessment: Reviewed RN Assessment  Last BM:  none documented since admission  Height:   Ht Readings from Last 1 Encounters:  2018-04-01 5\' 4"  (1.626 m)    Weight:   Wt Readings from Last 1 Encounters:  04-01-2018 66.1 kg    Ideal Body Weight:  54.5 kg  BMI:  Body mass index is 25.01 kg/m.  Estimated Nutritional Needs:   Kcal:  1305  Protein:  90-100 gm  Fluid:  1.5 L    Molli Barrows, RD, LDN, [place] Pager [phone] After Hours Pager [phone]

## 2018-04-12 NOTE — ED Notes (Signed)
PD at bedside.

## 2018-04-12 NOTE — Progress Notes (Addendum)
STROKE TEAM PROGRESS NOTE   HISTORY OF PRESENT ILLNESS (per record) Donna Ochoa a 85 y.o.femalepast medical history of chronic kidney diseasestage 5and AV fistula in place in preparation for hemodialysis which was never started, hypertension, diabetes with last known normal sometime on Wednesday when she was last seen by family, found in her car in a parking lot late night 3/13/early morning Apr 20, 2018 brought [facility] for evaluation. Noncontrast head CT shows a large confluent area of ischemic stroke superimposed bleed versus primary hypertensive bleed versus an aneurysmal bleed. Transferred to [facility] for further work-up. Awaiting family arrival at this time. GCS 7 (E1, V1, M5)  LKW:48-72h ago tpa given?: no,OSW Premorbid modified Rankin scale (mRS):unable to obtain ICH score - 3   SUBJECTIVE (INTERVAL History) Not following commands. Family member asleep in room. Dr Leonie Man will try to speak with family later regarding prognosis. Issues neurological condition remains poor and she is unresponsive. Serum sodium this morning was 149. Blood pressure is adequate controlled.    OBJECTIVE Vitals:   04-20-2018 0734 2018-04-20 0745 Apr 20, 2018 0800 20-Apr-2018 0815  BP:   (!) 132/43 (!) 163/53  Pulse: 95  90 92  Resp: 15 15 15 15   Temp:   (!) 96.4 F (35.8 C)   TempSrc:   Axillary   SpO2: 100%  100% 100%  Weight:      Height:        CBC:  Recent Labs  Lab April 20, 2018 0049 04-20-18 0716 04/20/2018 1201  WBC 9.2 9.3  --   NEUTROABS 7.8*  --   --   HGB 15.5* 14.1 13.6  HCT 53.1* 48.8* 40.0  MCV 100.6* 100.4*  --   PLT 91* 105*  --     Basic Metabolic Panel:  Recent Labs  Lab 2018/04/20 0049 April 20, 2018 0716 2018-04-20 1201  NA 150* 149* 147*  K 5.6* 5.7* 4.9  CL 119* 117*  --   CO2 16* 16*  --   GLUCOSE 368* 404*  --   BUN 91* 118*  --   CREATININE 4.83* 5.21*  --   CALCIUM 9.9 10.1  --   MG  --  2.3  --     Lipid Panel: No results found for:  CHOL, TRIG, HDL, CHOLHDL, VLDL, LDLCALC HgbA1c: No results found for: HGBA1C Urine Drug Screen:     Component Value Date/Time   LABOPIA NONE DETECTED 20-Apr-2018 0121   COCAINSCRNUR NONE DETECTED 04-20-2018 0121   LABBENZ NONE DETECTED 2018/04/20 0121   AMPHETMU NONE DETECTED Apr 20, 2018 0121   THCU NONE DETECTED 2018/04/20 0121   LABBARB NONE DETECTED 2018-04-20 0121    Alcohol Level     Component Value Date/Time   ETH <10 Apr 20, 2018 0049    IMAGING   Ct Angio Head W Or Wo Contrast Ct Angio Neck W Or Wo Contrast April 20, 2018 IMPRESSION:   CT HEAD IMPRESSION  1. No significant interval change in large confluent area of evolving cytotoxic edema within the left MCA territory, compatible with evolving acute left MCA territory infarct. Associated hemorrhagic transformation with parenchymal hemorrhage involving the left frontal lobe and left basal ganglia also unchanged.  2. Associated regional mass effect with 10 mm left-to-right shift, relatively stable. No hydrocephalus or ventricular trapping at this time.   CTA HEAD AND NECK IMPRESSION  1. Irregular filling with multifocal near occlusive stenoses involving a proximal left M2 branch as above. Finding felt to most likely reflect subocclusive and/or recanalized thrombus, although severe multifocal stenoses may be contributory as  well. Perfusion is seen distally within the infarcted left MCA territory. No aneurysm or other vascular abnormality seen underlying the left cerebral hemorrhage.  2. Atheromatous plaque about the carotid bifurcations with up to 40-50% stenosis on the right and 30% on the left.  3. Additional atherosclerotic change involving the intracranial circulation as above, with most notable findings including moderate to severe segmental right P2 stenoses, with moderate multifocal bilateral V4 stenoses.  4. Volume loss with irregular pleuroparenchymal scarring within the right lung, partially visualized.  5. Two separate  well-circumscribed left parotid masses measuring up to 2 cm, indeterminate. Nonemergent outpatient ENT referral and consultation could be performed for further workup and evaluation as clinically desired.   Ct Head Wo Contrast  04/19/2018 IMPRESSION:  1. Large confluent area of evolving cytotoxic edema within the left MCA distribution, most consistent with acute left MCA territory infarct. Associated hemorrhagic transformation with parenchymal hemorrhage involving the left frontal lobe and left basal ganglia. A possible underlying aneurysmal bleed arising from the left MCA bifurcation with secondary infarct could conceivably considered as well, although is felt to be less likely. Further assessment with dedicated CTA and/or MRA recommended to evaluate for this possibility.  2. Associated regional mass effect with 9 mm left-to-right shift. No frank hydrocephalus or ventricular trapping at this time.    Dg Chest Portable 1 View 2018-04-19 IMPRESSION:  Endotracheal tube terminates 3 cm above the carina. Right lower lobe opacity, likely combination of atelectasis and moderate right pleural effusion. Enteric tube terminates in the proximal gastric body.    Transthoracic Echocardiogram  00/00/2020 Pending   EKG - SR rate 89 BPM. See cardiology interpretation for full details.    PHYSICAL EXAM Blood pressure (!) 163/53, pulse 92, temperature (!) 96.4 F (35.8 C), temperature source Axillary, resp. rate 15, height 5\' 4"  (1.626 m), weight 66.1 kg, SpO2 100 %. Elderly African-American lady who is intubated and unresponsive. . Afebrile. Head is nontraumatic. Neck is supple without bruit.    Cardiac exam no murmur or gallop. Lungs are clear to auscultation. Distal pulses are well felt. Neurological Exam :  Intubated unresponsive.right gaze preference. Also movements sluggish. Pupils 3 mm small sluggishly reactive. Left corneal reflexes present and right is sluggish. Fundi not visualized. She does not  blink to threat on either side. Right lower facial weakness. Tongue midline. Motor system exam no spontaneous extremity movements. No withdrawal to sternal rub or nailbed pressure.plantars were unelicitable.       ASSESSMENT/PLAN Donna Ochoa is a 85 y.o. female with history of chronic kidney diseasestage 5and AV fistula in place in preparation for hemodialysis which was never started, hypertension, diabetes presenting in transfer from [facility] after she was found sitting in her car unresponsive.  She did not receive IV t-PA due to hemorrhage.  Stroke: Lt MCA territory infarct with hemorrhagic transformation  Possibly embolic - unknown source.  Resultant  Aphasia right hemiplegia and unresponsive state  CT head - Large confluent area of evolving cytotoxic edema within the left MCA distribution, most consistent with acute left MCA territory infarct. Associated hemorrhagic transformation with parenchymal hemorrhage involving the left frontal lobe and left basal ganglia  MRI head - not performed  MRA head - not performed  CTA H&N - Irregular filling with multifocal near occlusive stenoses involving a proximal left M2 branch. Finding felt to most likely reflect subocclusive and/or recanalized thrombus, although severe multifocal stenoses may be contributory as well  Carotid Doppler - CTA neck performed - carotid dopplers  not indicated.  2D Echo - pending  LDL - pending  HgbA1c - pending  UDS - negative  VTE prophylaxis - SCDs  Diet - NPO - gastric tube  aspirin 81 mg daily prior to admission, now on No antithrombotic  Ongoing aggressive stroke risk factor management  Therapy recommendations:  pending  Disposition:  Pending  Hypertension  Stable (cleviprex) . Long-term BP goal normotensive  Hyperlipidemia  Lipid lowering medication PTA:  Lipitor 80 mg daily  LDL pending, goal < 70  Current lipid lowering medication: none  Resume statin at  discharge  Diabetes  HgbA1c pending, goal < 7.0  Unc / Controlled  Other Stroke Risk Factors  Advanced age  Coronary artery disease  Midline Shift - 90mm L->R  Mannitol ordered   Other Active Problems  Thrombocytopenia - 91  Hyperkalemia - 5.7>5.6->4.1  CKD stage 5  Na - 149->150 ->143  Atheromatous plaque about the carotid bifurcations with up to 40-50% stenosis on the right  Two separate well-circumscribed left parotid masses measuring up to 2 cm, indeterminate. Nonemergent outpatient ENT referral and consultation  Suspected aspiration pneumonia - Rocephin & Zithromax per CCM  Neuro Surgery -  surgery not indicated   Check labs in AM Overall poor prognosis.    Hospital day # 0  Mikey Bussing PA-C [facility] Pager [phone] 04/04/2018, 12:28 PM I have personally obtained history,examined this patient, reviewed notes, independently viewed imaging studies, participated in medical decision making and plan of care.ROS completed by me personally and pertinent positives fully documented  I have made any additions or clarifications directly to the above note. Agree with note above.  Patient has presented with a large left MCA infarct with hemorrhagic transformation with significant cytotoxic edema and midline shift. Her neurological exam is quite poor. Family not available at the bedside to discuss goals of care. Continue ventilatory support. Give mannitol 20 mg 1 now. Check serum sodium and may need to start her on hypertonic saline. Appreciate neurosurgery consult and feels she is not a candidate for hemicraniectomy at present time.may need to discuss palliative care with family when available. Discussed with Dr. Doyne Keel a critical care medicine.we'll repeat CT scan of the head tomorrow morning. Strict blood pressure control systolic less than 846. keep you glycemic and normothermic.This patient is critically ill and at significant risk of neurological  worsening, death and care requires constant monitoring of vital signs, hemodynamics,respiratory and cardiac monitoring, extensive review of multiple databases, frequent neurological assessment, discussion with family, other specialists and medical decision making of high complexity.I have made any additions or clarifications directly to the above note.This critical care time does not reflect procedure time, or teaching time or supervisory time of PA/NP/Med Resident etc but could involve care discussion time.  I spent 40 minutes of neurocritical care time  in the care of  this patient.      Antony Contras, MD Medical Director [facility] Pager: [phone] 04/04/2018 1:02 PM  ADDENDUM :  I spoke to patient`s son and multiple family members and explained her poor prognosis from large Left MCA infract with now hemorrhagic transformation and brain herniation and chances of survival with meaningful quality of life being negligible. Son understands and agrees to DNR and do not escalte and needs time to think about withdrawal of care as his DAd had alarge stroke and died from it and he knows what he will have to go thru. D/W Dr Lynetta Mare  Antony Contras, MD  To contact  Stroke Continuity provider, please refer to [email or web address] After hours, contact General Neurology

## 2018-04-12 NOTE — Consult Note (Signed)
NAME:  Donna Ochoa, MRN:  [id number], DOB:  31-Dec-1933, LOS: 0 ADMISSION DATE:  Apr 19, 2018, CONSULTATION DATE:  04/19/2018 REFERRING MD:  Dr. Rory Percy, CHIEF COMPLAINT:  AMS  Brief History   30 yoF missing since Wednesday found with altered mental status in car grocery store. Found to have large ICH with surrounding cytotoxic edema.  Intubated for airway protection.    History of present illness   HPI obtained from medical chart review as patient is intubated and sedated on mechanical ventilation.  85 year old female with history of CKD stage 5 with L AVF- not yet on HD, HTN, DM, CAD, asthma, anemia, and arthritis who has been missing since Wednesday, 3/11.  Found unresponsive in her car in [employer] parking lot 3/13 and taken to [facility].  There, CT head showed large confluent area of ischemic stroke with superimposed bleed versus primary hypertensive bleed versus aneurysmal bleed.  Was placed on cardene for blood pressure control and hypertension of  171/80. She was emergently transferred to [facility].   At [facility], she required intubation for airway protection.  Labs noted for corrected Na 154, K 5.6, glucose 368, CK 1480, WBC 9.2, Hgb 15.5, normal coags.  PCCM consulted for further medical and ventilator management.   Past Medical History  CKD stage 5 with L AVF, HTN, DM, CAD, asthma, anemia, arthritis   Significant Hospital Events   3/11 missing 3/13 Admitted  Consults:   Procedures:  3/14 ETT >>  Significant Diagnostic Tests:  3/13 CTH >>  1. Large confluent area of evolving cytotoxic edema within the left MCA distribution, most consistent with acute left MCA territory infarct. Associated hemorrhagic transformation with parenchymal hemorrhage involving the left frontal lobe and left basal ganglia. A possible underlying aneurysmal bleed arising from the left MCA bifurcation with secondary infarct could conceivably considered as well, although is felt to be less likely. Further assessment with  dedicated CTA and/or MRA recommended to evaluate for this possibility. 2. Associated regional mass effect with 9 mm left-to-right shift. No frank hydrocephalus or ventricular trapping at this time.  3/13 CTH, CTA >> 1. No significant interval change in large confluent area of evolving cytotoxic edema within the left MCA territory, compatible with evolving acute left MCA territory infarct. Associated hemorrhagic transformation with parenchymal hemorrhage involving the left frontal lobe and left basal ganglia also unchanged. 2. Associated regional mass effect with 10 mm left-to-right shift, Relatively stable. No hydrocephalus or ventricular trapping at this Time. 1. Irregular filling with multifocal near occlusive stenoses involving a proximal left M2 branch as above. Finding felt to most likely reflect subocclusive and/or recanalized thrombus, although severe multifocal stenoses may be contributory as well. Perfusion is seen distally within the infarcted left MCA territory. No aneurysm or other vascular abnormality seen underlying the left cerebral hemorrhage. 2. Atheromatous plaque about the carotid bifurcations with up to 40-50% stenosis on the right and 30% on the left. 3. Additional atherosclerotic change involving the intracranial circulation as above, with most notable findings including moderate to severe segmental right P2 stenoses, with moderate multifocal bilateral V4 stenoses. 4. Volume loss with irregular pleuroparenchymal scarring within the right lung, partially visualized. 5. Two separate well-circumscribed left parotid masses measuring up to 2 cm, indeterminate. Nonemergent outpatient ENT referral and consultation could be performed for further workup and evaluation as clinically desired.  Micro Data:  3/14 MRSA PCR >> 3/14 BCx 2 >> 3/14 UC >> 3/14 trach asp >> 3/14 RVP>>  Antimicrobials:  3/14 azithro >>  3/14 ceftriaxone >>  Interim history/subjective:  Currently on  propofol at 10 mcg and cardene 2mg    Objective   Blood pressure 95/69, pulse 84, temperature 98.7 F (37.1 C), temperature source Rectal, resp. rate 20, height 5\' 6"  (1.676 m), weight 69.4 kg, SpO2 98 %.    Vent Mode: PRVC FiO2 (%):  [100 %] 100 % Set Rate:  [15 bmp] 15 bmp Vt Set:  [400 mL] 400 mL PEEP:  [5 cmH20] 5 cmH20 Plateau Pressure:  [23 cmH20] 23 cmH20   Intake/Output Summary (Last 24 hours) at 04-09-2018 4196 Last data filed at 04-09-18 0344 Gross per 24 hour  Intake 500.21 ml  Output -  Net 500.21 ml   Filed Weights   April 09, 2018 0024  Weight: 69.4 kg    Examination: General:  Elderly female intubated, sedated, and paralyzed from RSI on MV in NAD HEENT: MM pink/dry, pupils 3/reactive, anicteric, ETT 7.5 at 24, OGT, +JVD Neuro:  Sedated/ paralyzed CV:  rrr, possible systolic murmur- hard to assess over wheezing PULM:  MV breaths, diffuse ins/ exp wheeze GI: obese, mild distention, hypo BS Extremities: cool/dry, 2-3+ LE edema, LUE AVF Skin: no rashes, skin tear right upper arm   Resolved Hospital Problem list    Assessment & Plan:  Large ICH with surrounding cytotoxic edema - resultant right hemiplegia, hemiparesis and dysarthria  - ddx ischemic stroke with hemorrhagic transformation vs primary ICH, less likely aneurysmal given neg CTA findings P:  Neurology primary NSGY consulted in ER- no surgical intervention at this time SBP goal <140, cardene prn Insert Aline Frequent neuro checks MRI brain per neuro Goal Na 145-155, defer hypertonic saline to neurology  Acute hypoxic respiratory failure  R/o aspiration pneumonia Asthma- non smoker P:  Full MV support PRVC 8 cc/kg, rate 16 Wean FiO2 / PEEP 5 for goal sat 92-99% CXR and ABG pending VAP measures  Albuterol prn  Send trach aspirate and RVP Empiric abx as below PAD protocol w/ propofol/ prn fentanyl, RASS goal 0/-1  Hypertensive urgency Prolonged QTc CAD, HTN, HLD - EKG without acute ischemic  changes, but new LVH P:  Cardene for goal SBP < 140, MAP > 65 Assess/ trend trop/ EKG TTE  Hold home norvasc, ASA, lipitor, lasix  Hypernatremia, Corrected Na - 154 Hyperkalemia Likely metabolic acidosis CKD stage 5 - s/p NS 500 ml bolus  P:  Repeat BMP now, mag Insert foley Goal Na 145-155 per neurology   DM P:  SSI, CBG q 4  Best practice:  Diet: NPO Pain/Anxiety/Delirium protocol (if indicated): propofol/ prn fentanyl VAP protocol (if indicated): yes DVT prophylaxis: SCDs only GI prophylaxis: PPI Glucose control: SSI Mobility: BR Code Status: full  Family Communication: Son, Fritz Pickerel at bedside. [phone] Disposition: Neuro ICU  Labs   CBC: Recent Labs  Lab 09-Apr-2018 0049  WBC 9.2  NEUTROABS 7.8*  HGB 15.5*  HCT 53.1*  MCV 100.6*  PLT 91*    Basic Metabolic Panel: Recent Labs  Lab 09-Apr-2018 0048 04-09-2018 0049  NA  --  150*  K  --  5.6*  CL  --  119*  CO2  --  16*  GLUCOSE  --  368*  BUN  --  91*  CREATININE 5.10* 4.83*  CALCIUM  --  9.9   GFR: Estimated Creatinine Clearance: 8.4 mL/min (A) (by C-G formula based on SCr of 4.83 mg/dL (H)). Recent Labs  Lab 2018/04/09 0049  WBC 9.2    Liver Function Tests: Recent Labs  Lab 04-09-18  0049  AST 50*  ALT 54*  ALKPHOS 170*  BILITOT 1.6*  PROT 7.1  ALBUMIN 3.7   No results for input(s): LIPASE, AMYLASE in the last 168 hours. No results for input(s): AMMONIA in the last 168 hours.  ABG No results found for: PHART, PCO2ART, PO2ART, HCO3, TCO2, ACIDBASEDEF, O2SAT   Coagulation Profile: Recent Labs  Lab March 29, 2018 0049  INR 1.2    Cardiac Enzymes: Recent Labs  Lab 03-29-18 0049  CKTOTAL 1,480*    HbA1C: No results found for: HGBA1C  CBG: No results for input(s): GLUCAP in the last 168 hours.  Review of Systems:   unable  Past Medical History  She,  has a past medical history of Anemia, Arthritis, Asthma, Chronic kidney disease, Coronary artery disease (04/2014), Diabetes  mellitus without complication ([place]), Dyspnea, Head injury with loss of consciousness ([place]) (2014), History of blood transfusion, Hypertension, and Pneumonia.   Surgical History    Past Surgical History:  Procedure Laterality Date  . ABDOMINAL HYSTERECTOMY  1975  . AV FISTULA PLACEMENT Left 11/13/2015   Procedure: ARTERIOVENOUS (AV) FISTULA CREATION UPPER ARM LEFT;  Surgeon: Serafina Mitchell, MD;  Location: [place];  Service: Vascular;  Laterality: Left;  . BASCILIC VEIN TRANSPOSITION Left 03/30/2016   Procedure: 2ND STAGE BASILIC VEIN TRANSPOSITION;  Surgeon: Conrad Pe Ell, MD;  Location: [place];  Service: Vascular;  Laterality: Left;  . COLONOSCOPY    . CORONARY ANGIOPLASTY WITH STENT PLACEMENT    . ROTATOR CUFF REPAIR Right      Social History   reports that she has never smoked. She has never used smokeless tobacco. She reports that she does not drink alcohol or use drugs.   Family History   Her family history includes Diabetes in her brother, brother, father, maternal grandfather, maternal grandmother, mother, paternal grandfather, paternal grandmother, sister, and sister; Heart disease in her brother; Heart failure in her sister.   Allergies Allergies  Allergen Reactions  . Codeine Shortness Of Breath and Nausea And Vomiting  . Tramadol Nausea And Vomiting     Home Medications  Prior to Admission medications   Medication Sig Start Date End Date Taking? Authorizing Provider  amLODipine (NORVASC) 10 MG tablet Take 1 tablet (10 mg total) by mouth daily. 09/14/17 12/13/17  Arnoldo Lenis, MD  aspirin EC 81 MG tablet Take 81 mg by mouth daily.    [provider]  atorvastatin (LIPITOR) 80 MG tablet Take 1 tablet (80 mg total) by mouth at bedtime. 09/14/17   Arnoldo Lenis, MD  epoetin alfa (EPOGEN,PROCRIT) 00867 UNIT/ML injection Inject 20,000 Units into the skin every 21 ( twenty-one) days.    [provider]  furosemide (LASIX) 80 MG tablet Take 160 mg by mouth 2  (two) times daily.    [provider]  insulin lispro (HUMALOG) 100 UNIT/ML injection Inject 20 Units into the skin 2 (two) times daily with breakfast and lunch. With breakfast and lunch     [provider]  nitroGLYCERIN (NITROSTAT) 0.4 MG SL tablet Place 0.4 mg under the tongue every 5 (five) minutes as needed for chest pain.    [provider]  sodium bicarbonate 650 MG tablet Take 1,300 mg by mouth 2 (two) times daily.    [provider]     Critical care time: 22 mins    Kennieth Rad, MSN, AGACNP-BC [facility] Pgr: [phone] or if no answer [phone] March 29, 2018, 6:51 AM

## 2018-04-12 NOTE — Progress Notes (Signed)
Patient transported on vent from ED to [id number] without complication.

## 2018-04-12 DEATH — deceased

## 2020-04-21 IMAGING — CT CT ANGIOGRAPHY NECK
1 of 16 series · 3 of 33 positions shown · IV contrast (APPLIED)
Comparison: Prior CT from earlier the same day.

CLINICAL DATA: Initial evaluation for acute intracranial
hemorrhage.

EXAM:
CT ANGIOGRAPHY HEAD AND NECK
TECHNIQUE: Multidetector CT imaging of the head and neck was performed using
the standard protocol during bolus administration of intravenous
contrast. Multiplanar CT image reconstructions and MIPs were
obtained to evaluate the vascular anatomy. Carotid stenosis
measurements (when applicable) are obtained utilizing NASCET
criteria, using the distal internal carotid diameter as the
denominator.
CONTRAST:  75mL OMNIPAQUE IOHEXOL 350 MG/ML SOLN

[Series 10: cta neck/head · axial · 0.45mm/px · z∈[-306,-125]mm · 3 of 723 slices shown]
[im 181/723  soft-tissue]
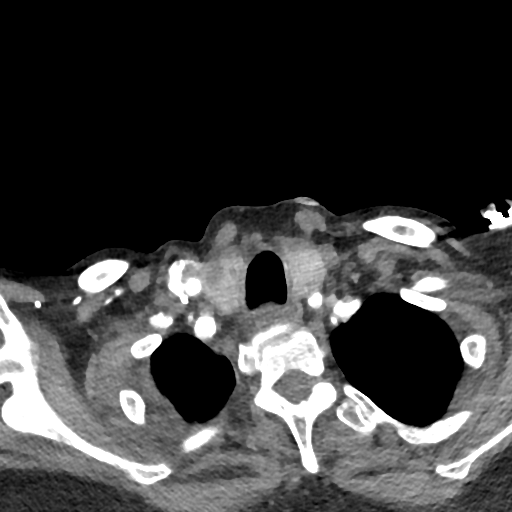
[im 362/723  bone]
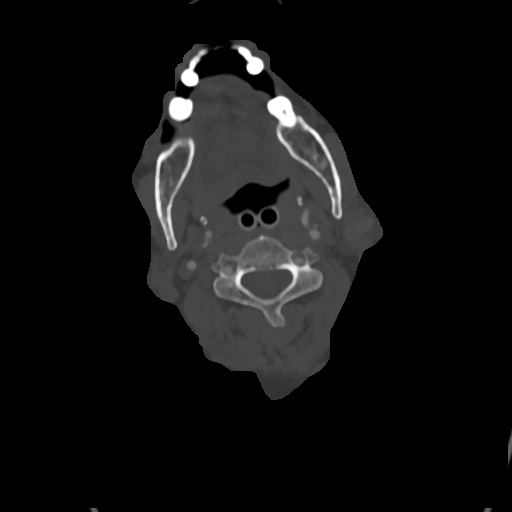
[im 542/723  soft-tissue]
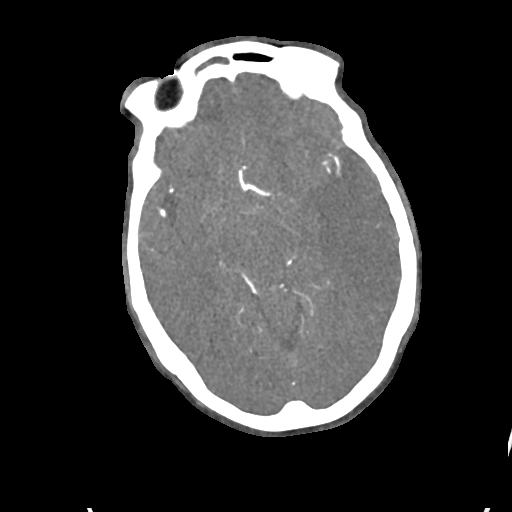

[3 of 33 positions shown; findings below may reference images not displayed]

FINDINGS: CT HEAD FINDINGS

Brain: Large confluent area of cytotoxic edema involving the left
MCA territory again seen, stable. Superimposed intraparenchymal
hemorrhage at the left frontal lobe and left basal ganglia also
relatively unchanged. Constellation of findings favored to reflect
acute MCA infarct with hemorrhagic transformation. Regional mass
effect with up to 10 mm left-to-right shift relatively similar. No
hydrocephalus or ventricular trapping. Basilar cisterns are crowded
but remain patent. Overall, appearance is not significantly changed
relative to prior CT from earlier same day. No other new findings.

Vascular: No appreciable hyperdense vessel. Calcified
atherosclerosis at the skull base.

Skull: Scalp soft tissues and calvarium demonstrate no acute
finding.

Sinuses: Chronic pan sinusitis, stable. Nasal trumpet sent place,
partially visualized. Mastoids are clear.

Orbits: Globes and orbital soft tissues demonstrate no acute
finding.

Review of the MIP images confirms the above findings

CTA NECK FINDINGS

Aortic arch: Visualized aortic arch within normal limits for caliber
with normal branch pattern. Moderate atherosclerotic change about
the arch and origin of the great vessels without hemodynamically
significant stenosis. Visualized subclavian arteries patent.

Right carotid system: Right common carotid artery patent from its
origin to the bifurcation without stenosis. Calcified plaque about
the right bifurcation with associated stenosis of up to
approximately 40-50% by NASCET criteria. Right ICA patent distally
to the skull base without additional stenosis, dissection, or
occlusion.

Left carotid system: Left common carotid artery patent from its
origin to the bifurcation without flow-limiting stenosis. Mixed
plaque about the left bifurcation and proximal left ICA with mild
stenosis of up to 30% by NASCET criteria. Left ICA widely patent
distally to the skull base without additional stenosis, dissection,
or occlusion.

Vertebral arteries: Both of the vertebral arteries arise from the
subclavian arteries. Right vertebral artery dominant. Vertebral
arteries widely patent within the neck without stenosis, dissection
or occlusion.

Skeleton: No acute osseous finding. No discrete lytic or blastic
osseous lesions. Mild degenerate spondylolysis within the cervical
spine.

Other neck: No other acute soft tissue abnormality within the neck.
Two separate well-circumscribed mass lesion seen within the left
parotid gland, measuring 1.8 cm and 2.0 cm respectively,
indeterminate. She thyroid diffusely heterogeneous with scattered
predominantly subcentimeter nodules, most likely related to goiter.

Upper chest: Volume loss with irregular pleuroparenchymal thickening
and scarring seen within the partially visualized right lung.
Aerated left lung clear. No other acute finding within the upper
chest.

Review of the MIP images confirms the above findings

CTA HEAD FINDINGS

Anterior circulation: Petrous segments patent bilaterally. Scattered
atheromatous plaque within the cavernous/supraclinoid ICAs with mild
to moderate diffuse narrowing (no more than 50%). ICA termini well
perfused. A1 segments patent bilaterally. Right A1 hypoplastic.
Normal anterior communicating artery. Anterior cerebral arteries
widely patent to their distal aspects.

Right M1 widely patent. Normal right MCA bifurcation. Distal right
MCA branches well perfused.

Left M1 widely patent proximally. There is irregular filling with
focal severe stenosis involving a proximal left M2 branch (series
10, image 202) with a second severe high-grade stenosis just
distally (series 10, image 191). Findings favored to reflect
partially recanalized and/or partially occlusive thrombus, although
severe multifocal stenoses may be contributory. Perfusion is seen
distally within the left MCA branches. No aneurysm identified. No
other vascular abnormality seen underlying the left cerebral
hemorrhage.

Posterior circulation: Multifocal atheromatous plaque within the
bilateral V4 segments with resultant mild to moderate multifocal
stenoses. Posterior inferior cerebral arteries patent bilaterally.
Basilar patent to its distal aspect without stenosis. Superior
cerebral arteries patent bilaterally. Both of the posterior cerebral
artery supplied via the basilar. Left PCA patent to its distal
aspect without flow-limiting stenosis. Multifocal moderate to severe
segmental stenoses present within the right P2 segment. Right PCA
also patent to its distal aspect

Venous sinuses: Grossly patent, although not well assessed due to
timing of the contrast bolus.

Anatomic variants: None significant.

Delayed phase: No abnormal enhancement.

Review of the MIP images confirms the above findings
IMPRESSION: CT HEAD IMPRESSION

1. No significant interval change in large confluent area of
evolving cytotoxic edema within the left MCA territory, compatible
with evolving acute left MCA territory infarct. Associated
hemorrhagic transformation with parenchymal hemorrhage involving the
left frontal lobe and left basal ganglia also unchanged.
2. Associated regional mass effect with 10 mm left-to-right shift,
relatively stable. No hydrocephalus or ventricular trapping at this
time.

CTA HEAD AND NECK IMPRESSION

1. Irregular filling with multifocal near occlusive stenoses
involving a proximal left M2 branch as above. Finding felt to most
likely reflect subocclusive and/or recanalized thrombus, although
severe multifocal stenoses may be contributory as well. Perfusion is
seen distally within the infarcted left MCA territory. No aneurysm
or other vascular abnormality seen underlying the left cerebral
hemorrhage.
2. Atheromatous plaque about the carotid bifurcations with up to
40-50% stenosis on the right and 30% on the left.
3. Additional atherosclerotic change involving the intracranial
circulation as above, with most notable findings including moderate
to severe segmental right P2 stenoses, with moderate multifocal
bilateral V4 stenoses.
4. Volume loss with irregular pleuroparenchymal scarring within the
right lung, partially visualized.
5. Two separate well-circumscribed left parotid masses measuring up
to 2 cm, indeterminate. Nonemergent outpatient ENT referral and
consultation could be performed for further workup and evaluation as
clinically desired.
